# Patient Record
Sex: Male | Born: 1975
Health system: Southern US, Community
[De-identification: ages and names within clinical notes are randomized; demographics above are authoritative.]

## PROBLEM LIST (undated history)

## (undated) DIAGNOSIS — F419 Anxiety disorder, unspecified: Secondary | ICD-10-CM

## (undated) DIAGNOSIS — Z8669 Personal history of other diseases of the nervous system and sense organs: Secondary | ICD-10-CM

## (undated) DIAGNOSIS — T7840XA Allergy, unspecified, initial encounter: Secondary | ICD-10-CM

## (undated) DIAGNOSIS — E7800 Pure hypercholesterolemia, unspecified: Secondary | ICD-10-CM

## (undated) DIAGNOSIS — Z8619 Personal history of other infectious and parasitic diseases: Secondary | ICD-10-CM

## (undated) DIAGNOSIS — M1990 Unspecified osteoarthritis, unspecified site: Secondary | ICD-10-CM

## (undated) DIAGNOSIS — K5792 Diverticulitis of intestine, part unspecified, without perforation or abscess without bleeding: Secondary | ICD-10-CM

## (undated) HISTORY — DX: Anxiety disorder, unspecified: F41.9

## (undated) HISTORY — DX: Personal history of other infectious and parasitic diseases: Z86.19

## (undated) HISTORY — DX: Personal history of other diseases of the nervous system and sense organs: Z86.69

## (undated) HISTORY — DX: Allergy, unspecified, initial encounter: T78.40XA

## (undated) HISTORY — DX: Diverticulitis of intestine, part unspecified, without perforation or abscess without bleeding: K57.92

## (undated) HISTORY — DX: Pure hypercholesterolemia, unspecified: E78.00

---
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
## 2005-07-30 ENCOUNTER — Ambulatory Visit: Payer: Self-pay | Admitting: Internal Medicine

## 2005-08-01 ENCOUNTER — Ambulatory Visit: Payer: Self-pay | Admitting: Internal Medicine

## 2005-12-19 ENCOUNTER — Emergency Department (HOSPITAL_COMMUNITY): Admission: EM | Admit: 2005-12-19 | Discharge: 2005-12-19 | Payer: Self-pay | Admitting: Emergency Medicine

## 2006-03-13 ENCOUNTER — Ambulatory Visit: Payer: Self-pay | Admitting: Internal Medicine

## 2006-03-13 ENCOUNTER — Ambulatory Visit (HOSPITAL_COMMUNITY): Admission: RE | Admit: 2006-03-13 | Discharge: 2006-03-13 | Payer: Self-pay | Admitting: Internal Medicine

## 2006-03-28 ENCOUNTER — Ambulatory Visit: Payer: Self-pay | Admitting: Internal Medicine

## 2007-02-02 ENCOUNTER — Ambulatory Visit: Payer: Self-pay | Admitting: Internal Medicine

## 2007-02-06 ENCOUNTER — Ambulatory Visit: Payer: Self-pay | Admitting: Internal Medicine

## 2007-02-06 LAB — CONVERTED CEMR LAB
AST: 22 units/L (ref 0–37)
Albumin: 3.8 g/dL (ref 3.5–5.2)
Cholesterol: 170 mg/dL (ref 0–200)
Potassium: 3.8 meq/L (ref 3.5–5.1)
Sodium: 141 meq/L (ref 135–145)
Total Bilirubin: 0.4 mg/dL (ref 0.3–1.2)
Total CHOL/HDL Ratio: 4.1
VLDL: 14 mg/dL (ref 0–40)

## 2007-07-21 DIAGNOSIS — Z87898 Personal history of other specified conditions: Secondary | ICD-10-CM | POA: Insufficient documentation

## 2007-07-21 DIAGNOSIS — Z8719 Personal history of other diseases of the digestive system: Secondary | ICD-10-CM | POA: Insufficient documentation

## 2007-07-21 DIAGNOSIS — Z8669 Personal history of other diseases of the nervous system and sense organs: Secondary | ICD-10-CM | POA: Insufficient documentation

## 2007-10-28 ENCOUNTER — Encounter (INDEPENDENT_AMBULATORY_CARE_PROVIDER_SITE_OTHER): Payer: Self-pay | Admitting: *Deleted

## 2007-10-28 ENCOUNTER — Ambulatory Visit: Payer: Self-pay | Admitting: Endocrinology

## 2007-10-28 DIAGNOSIS — R112 Nausea with vomiting, unspecified: Secondary | ICD-10-CM | POA: Insufficient documentation

## 2008-06-03 ENCOUNTER — Ambulatory Visit: Payer: Self-pay | Admitting: Internal Medicine

## 2008-06-03 DIAGNOSIS — E299 Testicular dysfunction, unspecified: Secondary | ICD-10-CM | POA: Insufficient documentation

## 2008-06-08 ENCOUNTER — Ambulatory Visit (HOSPITAL_BASED_OUTPATIENT_CLINIC_OR_DEPARTMENT_OTHER): Admission: RE | Admit: 2008-06-08 | Discharge: 2008-06-08 | Payer: Self-pay | Admitting: Internal Medicine

## 2008-06-09 ENCOUNTER — Telehealth: Payer: Self-pay | Admitting: Internal Medicine

## 2008-06-22 ENCOUNTER — Ambulatory Visit: Payer: Self-pay | Admitting: Internal Medicine

## 2008-06-22 LAB — CONVERTED CEMR LAB
LDH: 165 units/L (ref 94–250)
TSH: 0.5 microintl units/mL (ref 0.35–5.50)
Triglycerides: 61 mg/dL (ref 0–149)

## 2008-06-24 ENCOUNTER — Encounter: Payer: Self-pay | Admitting: Internal Medicine

## 2008-06-27 ENCOUNTER — Encounter (INDEPENDENT_AMBULATORY_CARE_PROVIDER_SITE_OTHER): Payer: Self-pay | Admitting: *Deleted

## 2008-06-27 ENCOUNTER — Ambulatory Visit: Payer: Self-pay | Admitting: Internal Medicine

## 2008-06-29 ENCOUNTER — Ambulatory Visit: Payer: Self-pay | Admitting: *Deleted

## 2008-06-29 DIAGNOSIS — S335XXA Sprain of ligaments of lumbar spine, initial encounter: Secondary | ICD-10-CM | POA: Insufficient documentation

## 2009-04-17 ENCOUNTER — Ambulatory Visit: Payer: Self-pay | Admitting: Internal Medicine

## 2010-02-05 ENCOUNTER — Emergency Department (HOSPITAL_BASED_OUTPATIENT_CLINIC_OR_DEPARTMENT_OTHER): Admission: EM | Admit: 2010-02-05 | Discharge: 2010-02-05 | Payer: Self-pay | Admitting: Emergency Medicine

## 2010-03-13 ENCOUNTER — Ambulatory Visit: Payer: Self-pay | Admitting: Internal Medicine

## 2010-03-15 ENCOUNTER — Encounter: Payer: Self-pay | Admitting: Internal Medicine

## 2010-03-15 LAB — CONVERTED CEMR LAB
ALT: 28 units/L (ref 0–53)
Albumin: 4.9 g/dL (ref 3.5–5.2)
Alkaline Phosphatase: 59 units/L (ref 39–117)
Cholesterol: 191 mg/dL (ref 0–200)
Indirect Bilirubin: 0.6 mg/dL (ref 0.0–0.9)
Rubeola IgG: 1.83 — ABNORMAL HIGH
Total Bilirubin: 0.8 mg/dL (ref 0.3–1.2)
Total CHOL/HDL Ratio: 4
Triglycerides: 93 mg/dL (ref <150)

## 2010-06-25 ENCOUNTER — Telehealth: Payer: Self-pay | Admitting: Internal Medicine

## 2010-09-18 NOTE — Progress Notes (Signed)
Summary: Lipitor refill  Phone Note Refill Request Call back at [phone] Message from:  Patient on June 25, 2010 8:52 AM  Refills Requested: Medication #1:  LIPITOR 10 MG  TABS Take 1 tablet by mouth once a day.   Dosage confirmed as above?Dosage Confirmed   Brand Name Necessary? No   Supply Requested: 3 months   Last Refilled: 03/13/2010  Method Requested: Electronic Next Appointment Scheduled: none Initial call taken by: Lannette Donath,  June 25, 2010 8:53 AM  Follow-up for Phone Call        call returned to patient at [phone], patients wife Angelique Blonder states she had requested the refill on the patients behalf. She state she received an email stating rx was expired. She was informed a new rx for Lipitor is on file at [facility], to please check with the pharmacy. She was informed that I spoke with the Pharmacy and rx is on file. Follow-up by: Glendell Docker CMA,  June 25, 2010 10:14 AM

## 2010-09-18 NOTE — Assessment & Plan Note (Signed)
Summary: cpx/dt   Vital Signs:  Patient profile:   35 year old male Height:      70 inches Weight:      177.75 pounds BMI:     25.60 O2 Sat:      95 % on Room air Temp:     98.0 degrees F oral Pulse rate:   74 / minute Pulse rhythm:   regular Resp:     18 per minute BP sitting:   100 / 60  (left arm) Cuff size:   large  Vitals Entered By: Glendell Docker CMA (March 13, 2010 11:12 AM)  O2 Flow:  Room air CC: Rm 2- CPX Is Patient Diabetic? No Pain Assessment Patient in pain? no       Does patient need assistance? Functional Status Self care Ambulation Normal Comments fasting for labs, physical form completion for work   Primary Care Provider:  D. Thomos Lemons DO  CC:  Rm 2- CPX.  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 * Flu Vaccination 2)  ! Sulfa 3)  ! 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    Physical Exam  General:  alert, well-developed, and well-nourished.   Head:  normocephalic and atraumatic.   Eyes:  pupils equal, pupils round, and pupils reactive to light.   Ears:  R ear normal and L ear normal.   Mouth:  good dentition and pharynx pink and moist.   Neck:  supple and no masses.   Lungs:  normal respiratory effort, normal breath sounds, no crackles, and no wheezes.   Heart:  normal rate, regular rhythm, no murmur, and no gallop.   Abdomen:  soft, non-tender, normal bowel sounds, no hepatomegaly, and no splenomegaly.   Extremities:  No clubbing, cyanosis, edema, or deformity noted with normal full range of motion of all joints.   Neurologic:  cranial nerves II-XII intact and gait normal.     Impression & Recommendations:  Problem # 1:  HEALTH MAINTENANCE EXAM (ICD-V70.0) Routine employer cpx.  I urged regular exercise.  PPD placed.  check MMR titer as requested   Orders: T- * Misc. Laboratory test [id number])  Td Booster: Tdap (01/19/2010)   Chol: 173 (  06/22/2008)   HDL: 44.0 (06/22/2008)   LDL: 117 (06/22/2008)   TG: 61 (06/22/2008) TSH: 0.50 (06/22/2008)     Problem # 2:  HYPERCHOLESTEROLEMIA (ICD-272.0) he is tolerating lipitor.  monitors LFTs and FLP His updated medication list for this problem includes:    Lipitor 10 Mg Tabs (Atorvastatin calcium) .[name] Kitchen... Take 1 tablet by mouth once a day  Orders: T-Lipid Profile ([id number]) T-Hepatic Function [id number])  Complete Medication List: 1)  Lipitor 10 Mg Tabs (Atorvastatin calcium) .... Take 1 tablet by mouth once a day  Other Orders: TB Skin Test [id number]) Admin 1st Vaccine ([id number])  Patient Instructions: 1)  Please schedule a follow-up appointment in 1 year. Prescriptions: LIPITOR 10 MG  TABS (ATORVASTATIN CALCIUM) Take  1 tablet by mouth once a day  #90 x 3   Entered and Authorized by:   D. Thomos Lemons DO   Signed by:   D. Thomos Lemons DO on 03/13/2010   Method used:   Electronically to        [facility]* (retail)       [address]       [address] Shipping/mailing       [place], [place]  [address]       Ph: [phone]       Fax: [phone]   RxID:   [id number]   Current Allergies (reviewed today): ! * FLU VACCINATION ! SULFA ! PCN   Preventive Care Screening  Last Tetanus Booster:    Date:  01/19/2010    Results:  Tdap    Immunizations Administered:  PPD Skin Test:    Vaccine Type: PPD    Site: left forearm    Mfr: Sanofi Pasteur    Dose: 0.1 ml    Route: ID    Given by: Glendell Docker CMA    Exp. Date: 06/21/2011    Lot #: [id number]   Prevention & Chronic Care Immunizations   Influenza vaccine: Not documented    Tetanus booster: 01/19/2010: Tdap    Pneumococcal vaccine: Not documented  Other Screening   Smoking status: never  (03/13/2010)  Lipids   Total Cholesterol: 173  (06/22/2008)   LDL: 117  (06/22/2008)   LDL Direct: Not documented   HDL: 44.0  (06/22/2008)   Triglycerides: 61  (06/22/2008)    SGOT (AST): 22  (06/22/2008)   SGPT (ALT): 29  (06/22/2008)   Alkaline phosphatase: 56  (02/06/2007)   Total bilirubin: 0.4  (02/06/2007)  Self-Management Support :    Lipid self-management support: Not documented

## 2010-09-18 NOTE — Assessment & Plan Note (Signed)
Summary: TB Skin Test Read  Nurse Visit   Primary Care Provider:  D. Thomos Lemons DO  CC:  TB skin test recheck.  History of Present Illness: The patient presented after 48 hours to check the injection site for positive or negative reaction  Injection site examination: No firm bump forms at the test site. Slightly reddish appearance and diameter was smaller than 5mm.  Assessment & Plan:  Negative TB skin test. Patient was counseled to call if experience any irritation of site.  Glendell Docker CMA  March 15, 2010 4:49 PM    Allergies: 1)  ! * Flu Vaccination 2)  ! Sulfa 3)  ! Pcn  PPD Results    Date of reading: 03/15/2010    Results: < 5mm    Interpretation: negative

## 2010-09-18 NOTE — Letter (Signed)
Summary: Employee Health Examination Certificate  Employee Health Examination Certificate   Imported By: Maryln Gottron 03/27/2010 15:53:53  _____________________________________________________________________  External Attachment:    Type:   Image     Comment:   External Document

## 2011-03-28 ENCOUNTER — Other Ambulatory Visit: Payer: Self-pay | Admitting: Internal Medicine

## 2011-03-28 NOTE — Telephone Encounter (Signed)
Rx refill sent to pharmacy.Patient is due for office visit 

## 2011-03-29 ENCOUNTER — Encounter: Payer: Self-pay | Admitting: Internal Medicine

## 2011-04-09 ENCOUNTER — Ambulatory Visit (INDEPENDENT_AMBULATORY_CARE_PROVIDER_SITE_OTHER): Payer: 59 | Admitting: Internal Medicine

## 2011-04-09 ENCOUNTER — Encounter: Payer: Self-pay | Admitting: Internal Medicine

## 2011-04-09 DIAGNOSIS — E7800 Pure hypercholesterolemia, unspecified: Secondary | ICD-10-CM

## 2011-04-09 DIAGNOSIS — Z0184 Encounter for antibody response examination: Secondary | ICD-10-CM | POA: Insufficient documentation

## 2011-04-09 DIAGNOSIS — F411 Generalized anxiety disorder: Secondary | ICD-10-CM

## 2011-04-09 DIAGNOSIS — Z0000 Encounter for general adult medical examination without abnormal findings: Secondary | ICD-10-CM

## 2011-04-09 DIAGNOSIS — E785 Hyperlipidemia, unspecified: Secondary | ICD-10-CM

## 2011-04-09 DIAGNOSIS — F419 Anxiety disorder, unspecified: Secondary | ICD-10-CM

## 2011-04-09 DIAGNOSIS — Z8249 Family history of ischemic heart disease and other diseases of the circulatory system: Secondary | ICD-10-CM

## 2011-04-09 MED ORDER — ATORVASTATIN CALCIUM 10 MG PO TABS: 10.0000 mg | ORAL_TABLET | Freq: Every day | ORAL | Status: DC

## 2011-04-09 MED ORDER — ESCITALOPRAM OXALATE 5 MG PO TABS: 5.0000 mg | ORAL_TABLET | Freq: Every day | ORAL | Status: DC

## 2011-04-09 NOTE — Progress Notes (Signed)
  Subjective:    Patient ID: Isaiah Leonard, male    DOB: [birth year], 35 y.o.   MRN: [id number]  HPI Pt presents to clinic for annual physical. Has h/o hyperlipidemia maintained on statin therapy without adverse effect. Notes family h/o premature heart disease in first degree relative. Denies chest pain, chest pressure, dyspnea at rest or with exertion. Taking low dose lexapro for h/o mild anxiety. Medication controls symptoms without side effect. No other complaints.  Reviewed pmh, medications and allergies.    Review of Systems  Constitutional: Negative for fatigue and unexpected weight change.  Respiratory: Negative for shortness of breath.   Cardiovascular: Negative for chest pain.  Gastrointestinal: Negative for abdominal pain.  All other systems reviewed and are negative.       Objective:   Physical Exam  Nursing note and vitals reviewed. Constitutional: He appears well-developed and well-nourished. No distress.  HENT:  Head: Normocephalic and atraumatic.  Right Ear: Tympanic membrane, external ear and ear canal normal.  Left Ear: Tympanic membrane, external ear and ear canal normal.  Nose: Nose normal.  Mouth/Throat: Oropharynx is clear and moist. No oropharyngeal exudate.  Eyes: Conjunctivae and EOM are normal. Pupils are equal, round, and reactive to light. Right eye exhibits no discharge. Left eye exhibits no discharge. No scleral icterus.  Neck: Neck supple. Carotid bruit is not present. No thyromegaly present.  Cardiovascular: Normal rate, regular rhythm, normal heart sounds and intact distal pulses.  Exam reveals no gallop and no friction rub.   No murmur heard. Pulmonary/Chest: Effort normal and breath sounds normal. No respiratory distress. He has no wheezes. He has no rales.  Abdominal: Bowel sounds are normal. He exhibits no distension and no mass. There is no hepatosplenomegaly. There is no tenderness. There is no rebound and no guarding. Hernia confirmed negative  in the right inguinal area and confirmed negative in the left inguinal area.  Genitourinary: Testes normal.  Lymphadenopathy:    He has no cervical adenopathy.       Right: No inguinal adenopathy present.       Left: No inguinal adenopathy present.  Neurological: He is alert.  Skin: Skin is warm and dry. No rash noted. He is not diaphoretic. No erythema. No pallor.  Psychiatric: He has a normal mood and affect.          Assessment & Plan:

## 2011-04-09 NOTE — Assessment & Plan Note (Signed)
Stable and mild. Well controlled with low dose ssri. rf lexapro.

## 2011-04-09 NOTE — Patient Instructions (Signed)
Please schedule lipid/lft 272.4 at [facility] prior to next visit

## 2011-04-09 NOTE — Assessment & Plan Note (Signed)
Nl exam. Obtain cpe labs. EKG obtained demonstrates nsr 75 with nl intervals/axis. No arrythmia or obvious evidence of ischemia.

## 2011-04-09 NOTE — Assessment & Plan Note (Signed)
Obtain NMR particle analysis with premature fam hx of cad.

## 2011-04-11 ENCOUNTER — Ambulatory Visit: Payer: 59

## 2011-04-11 DIAGNOSIS — E785 Hyperlipidemia, unspecified: Secondary | ICD-10-CM

## 2011-04-11 DIAGNOSIS — Z0000 Encounter for general adult medical examination without abnormal findings: Secondary | ICD-10-CM

## 2011-04-11 LAB — URINALYSIS, ROUTINE W REFLEX MICROSCOPIC
Leukocytes, UA: NEGATIVE
pH: 6 (ref 5.0–8.0)

## 2011-04-11 LAB — CBC WITH DIFFERENTIAL/PLATELET
Basophils Absolute: 0.1 10*3/uL (ref 0.0–0.1)
Basophils Relative: 0.8 % (ref 0.0–3.0)
Eosinophils Relative: 1.2 % (ref 0.0–5.0)
HCT: 43.6 % (ref 39.0–52.0)
Hemoglobin: 14.6 g/dL (ref 13.0–17.0)
Lymphs Abs: 1.8 10*3/uL (ref 0.7–4.0)
MCHC: 33.5 g/dL (ref 30.0–36.0)
Neutrophils Relative %: 69.5 % (ref 43.0–77.0)
RBC: 5.07 Mil/uL (ref 4.22–5.81)
WBC: 7.9 10*3/uL (ref 4.5–10.5)

## 2011-04-11 LAB — HEMOGLOBIN A1C: Hgb A1c MFr Bld: 6 % (ref 4.6–6.5)

## 2011-04-12 LAB — HEPATIC FUNCTION PANEL
ALT: 37 U/L (ref 0–53)
AST: 24 U/L (ref 0–37)

## 2011-04-12 LAB — LIPID PANEL
HDL: 50.3 mg/dL (ref >39.00)
Triglycerides: 103 mg/dL (ref 0.0–149.0)

## 2011-04-12 LAB — BASIC METABOLIC PANEL
CO2: 27 mEq/L (ref 19–32)
Chloride: 103 mEq/L (ref 96–112)
Creatinine, Ser: 0.9 mg/dL (ref 0.4–1.5)
Potassium: 3.7 mEq/L (ref 3.5–5.1)
Sodium: 137 mEq/L (ref 135–145)

## 2011-04-15 LAB — NMR LIPOPROFILE WITHOUT LIPIDS
HDL Particle Number: 36.2 umol/L (ref >=30.5)
LDL Particle Number: 1611 nmol/L — ABNORMAL HIGH (ref <1000)
LDL Size: 20.7 nm (ref >20.5)
LP-IR Score: 58 — ABNORMAL HIGH (ref <=45)

## 2011-04-24 ENCOUNTER — Other Ambulatory Visit: Payer: Self-pay | Admitting: Internal Medicine

## 2011-04-24 DIAGNOSIS — E7800 Pure hypercholesterolemia, unspecified: Secondary | ICD-10-CM

## 2011-04-24 MED ORDER — ATORVASTATIN CALCIUM 20 MG PO TABS: 20.0000 mg | ORAL_TABLET | Freq: Every day | ORAL | Status: DC

## 2011-09-09 ENCOUNTER — Other Ambulatory Visit: Payer: Self-pay | Admitting: Internal Medicine

## 2011-10-04 ENCOUNTER — Other Ambulatory Visit: Payer: Self-pay | Admitting: Internal Medicine

## 2011-10-04 ENCOUNTER — Other Ambulatory Visit: Payer: Self-pay | Admitting: *Deleted

## 2011-10-04 DIAGNOSIS — E7800 Pure hypercholesterolemia, unspecified: Secondary | ICD-10-CM

## 2011-10-04 LAB — BASIC METABOLIC PANEL
CO2: 24 mEq/L (ref 19–32)
Calcium: 9.2 mg/dL (ref 8.4–10.5)
Sodium: 143 mEq/L (ref 135–145)

## 2011-10-04 LAB — LIPID PANEL
HDL: 59 mg/dL (ref >39)
LDL Cholesterol: 98 mg/dL (ref 0–99)
VLDL: 10 mg/dL (ref 0–40)

## 2011-10-07 ENCOUNTER — Ambulatory Visit (INDEPENDENT_AMBULATORY_CARE_PROVIDER_SITE_OTHER): Payer: 59 | Admitting: Internal Medicine

## 2011-10-07 ENCOUNTER — Encounter: Payer: Self-pay | Admitting: Internal Medicine

## 2011-10-07 DIAGNOSIS — F419 Anxiety disorder, unspecified: Secondary | ICD-10-CM

## 2011-10-07 DIAGNOSIS — E7800 Pure hypercholesterolemia, unspecified: Secondary | ICD-10-CM

## 2011-10-07 DIAGNOSIS — R739 Hyperglycemia, unspecified: Secondary | ICD-10-CM

## 2011-10-07 DIAGNOSIS — R7303 Prediabetes: Secondary | ICD-10-CM | POA: Insufficient documentation

## 2011-10-07 DIAGNOSIS — R7309 Other abnormal glucose: Secondary | ICD-10-CM

## 2011-10-07 DIAGNOSIS — F411 Generalized anxiety disorder: Secondary | ICD-10-CM

## 2011-10-07 MED ORDER — ESCITALOPRAM OXALATE 5 MG PO TABS: 5.0000 mg | ORAL_TABLET | Freq: Every day | ORAL | Status: DC

## 2011-10-07 MED ORDER — ATORVASTATIN CALCIUM 20 MG PO TABS: 20.0000 mg | ORAL_TABLET | Freq: Every day | ORAL | Status: DC

## 2011-10-07 NOTE — Assessment & Plan Note (Signed)
Improved control. Obtain lipid/lft and repeat NMR particle analysis with next visit.

## 2011-10-07 NOTE — Patient Instructions (Signed)
Please schedule fasting labs prior to next visit Lipid, lft, NMR particle analysis 272.4 and a1c (hyperglycemia)

## 2011-10-07 NOTE — Assessment & Plan Note (Signed)
Stable. rf lexapro.

## 2011-10-07 NOTE — Progress Notes (Signed)
  Subjective:    Patient ID: Isaiah Leonard, male    DOB: 20-Jul-1976, 36 y.o.   MRN: [id number]  HPI Pt presents to clinic for followup of multiple medical problems. H/o hyperlipidemia and premature family hx of heart disease. S/p NMR particle analysis with subsequent increase of lipitor dose to 20mg  po qd. Tolerating without myalgias. F/u lipid under ideal control. lft's pending. Has noted minimal elevated of glucose on chem7. No active complaint. Total time of visit ~26 minutes of which >50% spent in counseling.   Past Medical History  Diagnosis Date  . Personal history of other diseases of digestive system   . Pure hypercholesterolemia   . History of shingles   . History of Guillain-Barre syndrome     age 64   No past surgical history on file.  reports that he has never smoked. He has never used smokeless tobacco. He reports that he drinks alcohol. He reports that he does not use illicit drugs. family history includes Diabetes in his mother; Hypertension in his father and mother; Multiple sclerosis in his sister; and Stroke in his brother. Allergies  Allergen Reactions  . Penicillins   . Sulfonamide Derivatives       Review of Systems see hpi     Objective:   Physical Exam  Nursing note and vitals reviewed. Constitutional: He appears well-developed and well-nourished. No distress.  Skin: He is not diaphoretic.          Assessment & Plan:

## 2011-10-07 NOTE — Assessment & Plan Note (Signed)
Minimal. Low sugar/carb diet and resume regular exercise. Obtain a1c with next visit labs.

## 2011-10-08 LAB — HEPATIC FUNCTION PANEL
ALT: 21 U/L (ref 0–53)
AST: 21 U/L (ref 0–37)
Albumin: 4.6 g/dL (ref 3.5–5.2)
Total Protein: 6.8 g/dL (ref 6.0–8.3)

## 2012-02-25 ENCOUNTER — Encounter: Payer: Self-pay | Admitting: Internal Medicine

## 2012-02-25 ENCOUNTER — Ambulatory Visit (INDEPENDENT_AMBULATORY_CARE_PROVIDER_SITE_OTHER): Payer: 59 | Admitting: Internal Medicine

## 2012-02-25 VITALS — BP 122/82 | HR 78 | Temp 98.0°F | Wt 178.0 lb

## 2012-02-25 DIAGNOSIS — M2550 Pain in unspecified joint: Secondary | ICD-10-CM

## 2012-02-25 LAB — URIC ACID: Uric Acid, Serum: 6.3 mg/dL (ref 4.0–7.8)

## 2012-02-25 LAB — SEDIMENTATION RATE: Sed Rate: 1 mm/hr (ref 0–16)

## 2012-02-25 NOTE — Progress Notes (Signed)
  Subjective:    Patient ID: Isaiah Leonard, male    DOB: August 18, 1976, 36 y.o.   MRN: [id number]  HPI Pt presents to clinic for evaluation of bony joint nodules. Notes chronic intermittent joint pain involving fingers and shoulders primarily. Recently noted development of bony nodules on distal finger joints. No inflammatory changes-denies swelling, erythema or warmth. Notes family hx of RA. May have seen rheumatology in the past several years ago. No alleviating or exacerbating factors.  Past Medical History  Diagnosis Date  . Personal history of other diseases of digestive system   . Pure hypercholesterolemia   . History of shingles   . History of Guillain-Barre syndrome     age 62   No past surgical history on file.  reports that he has never smoked. He has never used smokeless tobacco. He reports that he drinks alcohol. He reports that he does not use illicit drugs. family history includes Diabetes in his mother; Hypertension in his father and mother; Multiple sclerosis in his sister; and Stroke in his brother. Allergies  Allergen Reactions  . Penicillins   . Sulfonamide Derivatives        Review of Systems see hpi     Objective:   Physical Exam  Nursing note and vitals reviewed. Constitutional: He appears well-developed and well-nourished. No distress.  HENT:  Head: Normocephalic and atraumatic.  Musculoskeletal:       FROM bilateral hands/fingers. Small distal joint bony nodules without tenderness. No erythema, warmth or effusion.  Neurological: He is alert.  Skin: Skin is warm and dry. He is not diaphoretic.  Psychiatric: He has a normal mood and affect.          Assessment & Plan:

## 2012-02-25 NOTE — Assessment & Plan Note (Signed)
Obtain esr, RF, uric acid and ANA. May represent early OA.

## 2012-03-02 ENCOUNTER — Telehealth: Payer: Self-pay | Admitting: *Deleted

## 2012-03-02 NOTE — Telephone Encounter (Signed)
Notes Recorded by Regis Bill, CMA on 03/02/2012 at 10:01 AM Patient informed/SLS Notes Recorded by Verdene Rio, CMA on 02/28/2012 at 10:55 AM Left message to call office Notes Recorded by Edwyna Perfect, MD on 02/26/2012 at 6:44 PM Labs nl  Patient would like to know if he needs to F/U with you and/or elsewhere concerning nodules/SLS Please advise.

## 2012-03-02 NOTE — Telephone Encounter (Signed)
LMOM with contact name & number w/further MD detail [pt has f/u OV 08.24.13]/SLS

## 2012-03-02 NOTE — Telephone Encounter (Signed)
The lab tests do not suggest an unusual arthritis like rheumatoid or similar. We discussed in clinic that the bony finger nodules are suggestive of osteoarthritis.

## 2012-03-11 ENCOUNTER — Encounter: Payer: Self-pay | Admitting: Family

## 2012-03-11 ENCOUNTER — Telehealth: Payer: Self-pay | Admitting: Internal Medicine

## 2012-03-11 ENCOUNTER — Ambulatory Visit (INDEPENDENT_AMBULATORY_CARE_PROVIDER_SITE_OTHER): Payer: 59 | Admitting: Family

## 2012-03-11 VITALS — BP 130/100 | HR 89 | Temp 97.4°F | Resp 16 | Ht 70.0 in | Wt 181.0 lb

## 2012-03-11 DIAGNOSIS — K5732 Diverticulitis of large intestine without perforation or abscess without bleeding: Secondary | ICD-10-CM

## 2012-03-11 DIAGNOSIS — K5792 Diverticulitis of intestine, part unspecified, without perforation or abscess without bleeding: Secondary | ICD-10-CM | POA: Insufficient documentation

## 2012-03-11 MED ORDER — METRONIDAZOLE 500 MG PO TABS: 500.0000 mg | ORAL_TABLET | Freq: Three times a day (TID) | ORAL | Status: AC

## 2012-03-11 MED ORDER — CIPROFLOXACIN HCL 500 MG PO TABS: 500.0000 mg | ORAL_TABLET | Freq: Two times a day (BID) | ORAL | Status: AC

## 2012-03-11 NOTE — Patient Instructions (Addendum)
Keep a liquid diet for the next 2-3 days, then advance as tolerated to a low fiber diet as tolerated- (see below) until you are seen back in the office in 1 week. Go to the ER if you develop blood in stool, worsening abdominal pain, fever >101. Call if symptoms are not improved in 2-3 days. Follow up in 1 week.   Diverticulitis A diverticulum is a small pouch or sac on the colon. Diverticulosis is the presence of these diverticula on the colon. Diverticulitis is the irritation (inflammation) or infection of diverticula. CAUSES  The colon and its diverticula contain bacteria. If food particles block the tiny opening to a diverticulum, the bacteria inside can grow and cause an increase in pressure. This leads to infection and inflammation and is called diverticulitis. SYMPTOMS   Abdominal pain and tenderness. Usually, the pain is located on the left side of your abdomen. However, it could be located elsewhere.   Fever.   Bloating.   Feeling sick to your stomach (nausea).   Throwing up (vomiting).   Abnormal stools.  DIAGNOSIS  Your caregiver will take a history and perform a physical exam. Since many things can cause abdominal pain, other tests may be necessary. Tests may include:  Blood tests.   Urine tests.   X-ray of the abdomen.   CT scan of the abdomen.  Sometimes, surgery is needed to determine if diverticulitis or other conditions are causing your symptoms. TREATMENT  Most of the time, you can be treated without surgery. Treatment includes:  Resting the bowels by only having liquids for a few days. As you improve, you will need to eat a low-fiber diet.   Intravenous (IV) fluids if you are losing body fluids (dehydrated).   Antibiotic medicines that treat infections may be given.   Pain and nausea medicine, if needed.   Surgery if the inflamed diverticulum has burst.  HOME CARE INSTRUCTIONS   Try a clear liquid diet (broth, tea, or water for as long as directed by  your caregiver). You may then gradually begin a low-fiber diet as tolerated. A low-fiber diet is a diet with less than 10 grams of fiber. Choose the foods below to reduce fiber in the diet:   White breads, cereals, rice, and pasta.   Cooked fruits and vegetables or soft fresh fruits and vegetables without the skin.   Ground or well-cooked tender beef, ham, veal, lamb, pork, or poultry.   Eggs and seafood.   After your diverticulitis symptoms have improved, your caregiver may put you on a high-fiber diet. A high-fiber diet includes 14 grams of fiber for every 1000 calories consumed. For a standard 2000 calorie diet, you would need 28 grams of fiber. Follow these diet guidelines to help you increase the fiber in your diet. It is important to slowly increase the amount fiber in your diet to avoid gas, constipation, and bloating.   Choose whole-grain breads, cereals, pasta, and brown rice.   Choose fresh fruits and vegetables with the skin on. Do not overcook vegetables because the more vegetables are cooked, the more fiber is lost.   Choose more nuts, seeds, legumes, dried peas, beans, and lentils.   Look for food products that have greater than 3 grams of fiber per serving on the Nutrition Facts label.   Take all medicine as directed by your caregiver.   If your caregiver has given you a follow-up appointment, it is very important that you go. Not going could result in lasting (chronic)  or permanent injury, pain, and disability. If there is any problem keeping the appointment, call to reschedule.  SEEK MEDICAL CARE IF:   Your pain does not improve.   You have a hard time advancing your diet beyond clear liquids.   Your bowel movements do not return to normal.  SEEK IMMEDIATE MEDICAL CARE IF:   Your pain becomes worse.   You have an oral temperature above 102 F (38.9 C), not controlled by medicine.   You have repeated vomiting.   You have bloody or black, tarry stools.    Symptoms that brought you to your caregiver become worse or are not getting better.  MAKE SURE YOU:   Understand these instructions.   Will watch your condition.   Will get help right away if you are not doing well or get worse.  Document Released: 05/15/2005 Document Revised: 07/25/2011 Document Reviewed: 09/10/2010 [facility] Patient Information 2012 [place], [place].

## 2012-03-11 NOTE — Assessment & Plan Note (Signed)
36 yr old male with hx of diverticulitis presents with 24 hr hx of LLQ pain.  Symptoms most consistent with diverticultis. Will plan to treat with cipro and flagyl.   I also recommended to the patient that he undergo a CT of the abdomen today.  He refuses stating, "My wife is a Engineer, civil (consulting) and my sister is a doctor and they have signed off on this."  I explained to the patient that the CT scan is to help rule out abscess/bowel perforation/peritonitis which can sometimes be a complication of diverticulitis and at times can be life threatening.  Pt states that he understands but a CT scan is not needed.  Reiterated to pt that it remains my recommendation that he proceed with CT.  Pt advised to follow up and to  follow dietary recs as outlined in AVS.

## 2012-03-11 NOTE — Progress Notes (Signed)
  Subjective:    Patient ID: Isaiah Leonard, male    DOB: 02-Jan-1976, 36 y.o.   MRN: [id number]  HPI  Isaiah Leonard is a 36 yr old male who presents today with chief complaint of abdominal pain.  He has a known history of diverticulitis.  Reports tenderness on the left side with associated mucousy bowel movements since yesterday.  He denies associated fever, vomiting or hematochezia.    Pt expressed to CMA that he was very upset about being asked to come in to be seen.  He asks me to check if diverticulitis is listed in his chart, "before this gets out of hand."   Review of Systems See HPI  Past Medical History  Diagnosis Date  . Personal history of other diseases of digestive system   . Pure hypercholesterolemia   . History of shingles   . History of Guillain-Barre syndrome     age 18    History   Social History  . Marital Status: Married    Spouse Name: N/A    Number of Children: N/A  . Years of Education: N/A   Occupational History  . Not on file.   Social History Main Topics  . Smoking status: Never Smoker   . Smokeless tobacco: Never Used  . Alcohol Use: Yes  . Drug Use: No  . Sexually Active: Not on file   Other Topics Concern  . 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  Family History  Problem Relation Age of Onset  . Diabetes Mother   . Hypertension Mother   . Hypertension Father   . Multiple sclerosis Sister   . Stroke Brother     history of etoh,a nd drug abuse    Allergies  Allergen Reactions  . Penicillins   . Sulfonamide Derivatives     Current Outpatient Prescriptions on File Prior to Visit  Medication Sig Dispense Refill  . atorvastatin (LIPITOR) 20 MG tablet Take 1 tablet (20 mg total) by  mouth daily.  90 tablet  3  . escitalopram (LEXAPRO) 5 MG tablet Take 1 tablet (5 mg total) by mouth daily.  90 tablet  3    BP 130/100  Pulse 89  Temp 97.4 F (36.3 C) (Oral)  Resp 16  Ht 5\' 10"  (1.778 m)  Wt 181 lb (82.101 kg)  BMI 25.97 kg/m2  SpO2 99%       Objective:   Physical Exam  Constitutional: He appears well-developed and well-nourished. No distress.  Cardiovascular: Normal rate and regular rhythm.   No murmur heard. Pulmonary/Chest: Effort normal and breath sounds normal. No respiratory distress. He has no wheezes. He has no rales. He exhibits no tenderness.  Abdominal: Soft. Bowel sounds are normal.       + left lower quadrant tenderness to palpation with mild guarding.   Psychiatric: His speech is normal and behavior is normal. Judgment and thought content normal. His affect is angry. Cognition and memory are normal.          Assessment & Plan:

## 2012-03-11 NOTE — Telephone Encounter (Signed)
Patient states that he has a hx of diverticulitis and feels like he is having a flare-up with it. He states that all of his symptoms are the same with diverticulitis. He would like to know if Dr. Rodena Medin would prescribe him Cipro? Or, would he need to come in?  [facility]. Thanks!

## 2012-03-11 NOTE — Telephone Encounter (Signed)
Per Vo, Sandford Craze, scheduled Pt OV at 2:30p today/SLS

## 2012-03-11 NOTE — Telephone Encounter (Signed)
Per Vo,

## 2012-04-13 ENCOUNTER — Encounter: Payer: Self-pay | Admitting: Internal Medicine

## 2012-04-13 ENCOUNTER — Ambulatory Visit (INDEPENDENT_AMBULATORY_CARE_PROVIDER_SITE_OTHER): Payer: 59 | Admitting: Internal Medicine

## 2012-04-13 ENCOUNTER — Ambulatory Visit (HOSPITAL_BASED_OUTPATIENT_CLINIC_OR_DEPARTMENT_OTHER)
Admission: RE | Admit: 2012-04-13 | Discharge: 2012-04-13 | Disposition: A | Discharge status: Home / Self Care | Payer: 59 | Source: Ambulatory Visit | Attending: Internal Medicine | Admitting: Internal Medicine

## 2012-04-13 VITALS — BP 118/76 | HR 85 | Temp 98.1°F | Resp 16 | Ht 68.0 in | Wt 183.0 lb

## 2012-04-13 DIAGNOSIS — M25519 Pain in unspecified shoulder: Secondary | ICD-10-CM

## 2012-04-13 DIAGNOSIS — R739 Hyperglycemia, unspecified: Secondary | ICD-10-CM

## 2012-04-13 DIAGNOSIS — R7309 Other abnormal glucose: Secondary | ICD-10-CM

## 2012-04-13 DIAGNOSIS — E7800 Pure hypercholesterolemia, unspecified: Secondary | ICD-10-CM

## 2012-04-13 MED ORDER — DICLOFENAC SODIUM 75 MG PO TBEC: DELAYED_RELEASE_TABLET | ORAL | Status: AC

## 2012-04-13 NOTE — Progress Notes (Signed)
  Subjective:    Patient ID: Isaiah Leonard, male    DOB: [birth year], 36 y.o.   MRN: [id number]  HPI  Pt presents to clinic for followup of multiple medical problems.  Past Medical History  Diagnosis Date  . Personal history of other diseases of digestive system   . Pure hypercholesterolemia   . History of shingles   . History of Guillain-Barre syndrome     age 20   No past surgical history on file.  reports that he has never smoked. He has never used smokeless tobacco. He reports that he drinks alcohol. He reports that he does not use illicit drugs. family history includes Diabetes in his mother; Hypertension in his father and mother; Multiple sclerosis in his sister; and Stroke in his brother. Allergies  Allergen Reactions  . Aspirin   . Penicillins   . Sulfonamide Derivatives       Review of Systems see hpi     Objective:   Physical Exam  Nursing note and vitals reviewed. Constitutional: He appears well-developed and well-nourished. No distress.  HENT:  Head: Normocephalic and atraumatic.  Musculoskeletal:       Left shoulder-no crepitus. FROM though demonstrates compensatory drop of right shoulder with abduction. +pain with abduction against resistance.   Neurological: He is alert.  Skin: He is not diaphoretic.  Psychiatric: He has a normal mood and affect.          Assessment & Plan:

## 2012-04-13 NOTE — Assessment & Plan Note (Signed)
Obtain lipid/lft. 

## 2012-04-13 NOTE — Patient Instructions (Signed)
Please schedule fasting labs prior to your next appointment Lipid/lft-272.4 and chem7-hyperglycemia

## 2012-04-13 NOTE — Assessment & Plan Note (Signed)
Obtain chem7/a1c

## 2012-04-13 NOTE — Assessment & Plan Note (Signed)
Obtain plain radiograph of left shoulder. Attempt course of voltaren with food and no other nsaid. PT not currently feasible with pt's schedule. Consider referral if no improvement

## 2012-06-13 ENCOUNTER — Encounter (HOSPITAL_BASED_OUTPATIENT_CLINIC_OR_DEPARTMENT_OTHER): Payer: Self-pay | Admitting: Student

## 2012-06-13 ENCOUNTER — Emergency Department (HOSPITAL_BASED_OUTPATIENT_CLINIC_OR_DEPARTMENT_OTHER)
Admission: EM | Admit: 2012-06-13 | Discharge: 2012-06-13 | Disposition: A | Discharge status: Home / Self Care | Payer: 59 | Attending: Emergency Medicine | Admitting: Emergency Medicine

## 2012-06-13 ENCOUNTER — Emergency Department (HOSPITAL_BASED_OUTPATIENT_CLINIC_OR_DEPARTMENT_OTHER): Payer: 59

## 2012-06-13 DIAGNOSIS — E7800 Pure hypercholesterolemia, unspecified: Secondary | ICD-10-CM | POA: Insufficient documentation

## 2012-06-13 DIAGNOSIS — S61209A Unspecified open wound of unspecified finger without damage to nail, initial encounter: Secondary | ICD-10-CM | POA: Insufficient documentation

## 2012-06-13 DIAGNOSIS — Z8619 Personal history of other infectious and parasitic diseases: Secondary | ICD-10-CM | POA: Insufficient documentation

## 2012-06-13 DIAGNOSIS — Z8719 Personal history of other diseases of the digestive system: Secondary | ICD-10-CM | POA: Insufficient documentation

## 2012-06-13 DIAGNOSIS — IMO0001 Reserved for inherently not codable concepts without codable children: Secondary | ICD-10-CM | POA: Insufficient documentation

## 2012-06-13 DIAGNOSIS — Y929 Unspecified place or not applicable: Secondary | ICD-10-CM | POA: Insufficient documentation

## 2012-06-13 DIAGNOSIS — Y9389 Activity, other specified: Secondary | ICD-10-CM | POA: Insufficient documentation

## 2012-06-13 DIAGNOSIS — Z79899 Other long term (current) drug therapy: Secondary | ICD-10-CM | POA: Insufficient documentation

## 2012-06-13 DIAGNOSIS — G610 Guillain-Barre syndrome: Secondary | ICD-10-CM | POA: Insufficient documentation

## 2012-06-13 DIAGNOSIS — S61259A Open bite of unspecified finger without damage to nail, initial encounter: Secondary | ICD-10-CM

## 2012-06-13 MED ORDER — CLINDAMYCIN HCL 150 MG PO CAPS: 300.0000 mg | ORAL_CAPSULE | Freq: Three times a day (TID) | ORAL | Status: AC

## 2012-06-13 MED ORDER — CLINDAMYCIN HCL 150 MG PO CAPS
300.0000 mg | ORAL_CAPSULE | Freq: Once | ORAL | Status: AC
  Administered 2012-06-13: 300 mg via ORAL
  Filled 2012-06-13: qty 2

## 2012-06-13 NOTE — ED Notes (Signed)
Patient states that he was bitten by a horse this AM.  Injury is on his right hand, 4 th digit. A i.5 cm lac noted on the ventral side of his finger with some abrasions noted on the dorsal surface of his finger. CMS intact.

## 2012-06-13 NOTE — ED Provider Notes (Signed)
Medical screening examination/treatment/procedure(s) were performed by non-physician practitioner and as supervising physician I was immediately available for consultation/collaboration.   Rolan Bucco, MD 06/13/12 1536

## 2012-06-13 NOTE — ED Provider Notes (Signed)
History     CSN: [id number]  Arrival date & time 06/13/12  1402   First MD Initiated Contact with Patient 06/13/12 1416      Chief Complaint  Patient presents with  . Animal Bite    4th digit on right hand    (Consider location/radiation/quality/duration/timing/severity/associated sxs/prior treatment) Patient is a 36 y.o. male presenting with animal bite. The history is provided by the patient.  Animal Bite  The incident occurred just prior to arrival. Pertinent negatives include no numbness. Associated symptoms comments: Patient bitten by his horse on right ring finger. No other injury..    Past Medical History  Diagnosis Date  . Personal history of other diseases of digestive system   . Pure hypercholesterolemia   . History of shingles   . History of Guillain-Barre syndrome     age 17    History reviewed. No pertinent past surgical history.  Family History  Problem Relation Age of Onset  . Diabetes Mother   . Hypertension Mother   . Hypertension Father   . Multiple sclerosis Sister   . Stroke Brother     history of etoh,a nd drug abuse    History  Substance Use Topics  . Smoking status: Never Smoker   . Smokeless tobacco: Never Used  . Alcohol Use: Yes      Review of Systems  Musculoskeletal:       See HPI.  Skin:       See HPI. C/O laceration/abrasion top and bottom of right ring finger.  Neurological: Negative for numbness.    Allergies  Aspirin; Penicillins; and Sulfonamide derivatives  Home Medications   Current Outpatient Rx  Name Route Sig Dispense Refill  . ATORVASTATIN CALCIUM 20 MG PO TABS Oral Take 1 tablet (20 mg total) by mouth daily. 90 tablet 3  . ESCITALOPRAM OXALATE 5 MG PO TABS Oral Take 1 tablet (5 mg total) by mouth daily. 90 tablet 3    BP 136/107  Pulse 71  Temp 98.5 F (36.9 C) (Oral)  Resp 20  Wt 185 lb (83.915 kg)  SpO2 100%  Physical Exam  Constitutional: He is oriented to person, place, and time. He appears  well-developed and well-nourished.  Neck: Normal range of motion.  Pulmonary/Chest: Effort normal.  Musculoskeletal: Normal range of motion.       FROM all joints of right 4th finger. Minimal swelling.Nail intact.  Neurological: He is alert and oriented to person, place, and time.  Skin: Skin is warm and dry.       Superficial abrasion to dorsal right 4th finger distally and shallow laceration palmar finger over DIP joint.   Psychiatric: He has a normal mood and affect.    ED Course  Procedures (including critical care time)  Labs Reviewed - No data to display Dg Finger Ring Right  06/13/2012  *RADIOLOGY REPORT*  Clinical Data: 36 year old male with right ring finger injury and pain.  RIGHT RING FINGER 2+V  Comparison: 03/13/2006  Findings: No evidence of acute fracture, subluxation or dislocation identified.  No radio-opaque foreign bodies are present.  No focal bony lesions are noted.  The joint spaces are unremarkable.  IMPRESSION: Unremarkable right ring finger.   Original Report Authenticated By: Rosendo Gros, M.D.      No diagnosis found.  1. Horse bite, finger   MDM  Wounds will not be sutured secondary to mechanism of injury. X-ray negative. Will treat with abx and bulky dressing.  Rodena Medin, PA-C 06/13/12 1530

## 2012-06-13 NOTE — ED Notes (Signed)
Horse bite to digit 4 on right hand.

## 2012-08-18 ENCOUNTER — Encounter: Payer: Self-pay | Admitting: Internal Medicine

## 2012-08-18 ENCOUNTER — Ambulatory Visit (INDEPENDENT_AMBULATORY_CARE_PROVIDER_SITE_OTHER): Payer: 59 | Admitting: Internal Medicine

## 2012-08-18 VITALS — BP 126/84 | HR 74 | Temp 98.0°F | Resp 16 | Wt 184.5 lb

## 2012-08-18 DIAGNOSIS — M7710 Lateral epicondylitis, unspecified elbow: Secondary | ICD-10-CM

## 2012-08-18 MED ORDER — METHYLPREDNISOLONE 4 MG PO KIT: PACK | ORAL | Status: DC

## 2012-08-18 NOTE — Progress Notes (Signed)
  Subjective:    Patient ID: Isaiah Leonard, male    DOB: 1976-04-25, 36 y.o.   MRN: [id number]  HPI Pt presents to clinic for evaluation of elbow pain. Notes persistent right lateral elbow pain worsening over the last one month. No injury. Taking no medication for the problem. Pain worsens with pronation. No other alleviating or exacerbating factors.   Past Medical History  Diagnosis Date  . Personal history of other diseases of digestive system   . Pure hypercholesterolemia   . History of shingles   . History of Guillain-Barre syndrome     age 28   No past surgical history on file.  reports that he has never smoked. He has never used smokeless tobacco. He reports that he drinks alcohol. He reports that he does not use illicit drugs. family history includes Diabetes in his mother; Hypertension in his father and mother; Multiple sclerosis in his sister; and Stroke in his brother. Allergies  Allergen Reactions  . Aspirin   . Penicillins   . Sulfonamide Derivatives      Review of Systems see hpi    Objective:   Physical Exam  Nursing note and vitals reviewed. Constitutional: He appears well-developed and well-nourished. No distress.  HENT:  Head: Normocephalic and atraumatic.  Musculoskeletal:       Right elbow-no erythema warmth or effusion. Mild tenderness lateral epicondyle. FROM of elbow.   Neurological: He is alert.  Skin: Skin is warm and dry. He is not diaphoretic.  Psychiatric: He has a normal mood and affect.          Assessment & Plan:

## 2012-08-22 DIAGNOSIS — M7710 Lateral epicondylitis, unspecified elbow: Secondary | ICD-10-CM | POA: Insufficient documentation

## 2012-08-22 NOTE — Assessment & Plan Note (Signed)
Attempt medrol dosepak. Followup if no improvement or worsening. Consider sports medicine consult if sx's persist

## 2012-09-14 ENCOUNTER — Other Ambulatory Visit: Payer: Self-pay | Admitting: Family Medicine

## 2012-09-14 ENCOUNTER — Telehealth: Payer: Self-pay | Admitting: Internal Medicine

## 2012-09-14 DIAGNOSIS — IMO0002 Reserved for concepts with insufficient information to code with codable children: Secondary | ICD-10-CM

## 2012-09-14 NOTE — Telephone Encounter (Signed)
Have referred him to sports med, let him know to try icing the elbow for 15 minutes and then applying Aspercreme twice daily til seen by sprots med

## 2012-09-14 NOTE — Telephone Encounter (Signed)
Dr Rodena Medin gave the patient an anti inflammatory for his elbow.  Told the patient to call if when he came off the meds he still had pain in the elbow and we would do a referral to sports medicine.  Patient wants the referral

## 2012-09-14 NOTE — Telephone Encounter (Signed)
Please advise 

## 2012-09-15 NOTE — Telephone Encounter (Signed)
Pt informed and voiced understanding. Pt also asked when his appt with Efraim Kaufmann was and I informed him 10-16-12

## 2012-09-15 NOTE — Telephone Encounter (Signed)
Left a message for patient to return my call. 

## 2012-09-18 ENCOUNTER — Ambulatory Visit: Payer: 59 | Admitting: Family Medicine

## 2012-09-21 ENCOUNTER — Ambulatory Visit (INDEPENDENT_AMBULATORY_CARE_PROVIDER_SITE_OTHER): Payer: 59 | Admitting: Family Medicine

## 2012-09-21 VITALS — BP 143/94 | HR 87 | Ht 68.0 in | Wt 183.0 lb

## 2012-09-21 DIAGNOSIS — M7710 Lateral epicondylitis, unspecified elbow: Secondary | ICD-10-CM

## 2012-09-21 NOTE — Patient Instructions (Addendum)
You have lateral epicondylitis and forearm strain. Try to avoid painful activities as much as possible except when doing home exercises. Ice the area 3-4 times a day for 15 minutes at a time. Tylenol or aleve as needed for pain. Counterforce brace as directed can help unload area - wear this regularly if it provides you with relief. Home Pronation/supination with 1 pound weight, wrist extension, stretching - do these once a day 3 sets of 10. Consider formal physical therapy. Consider injection for short term pain relief if the above is not helping (typically beneficial if you're only having pain up at the elbow at insertion). Follow up with me in 5-6 weeks for reevaluation.

## 2012-09-22 ENCOUNTER — Encounter: Payer: Self-pay | Admitting: Family Medicine

## 2012-09-22 NOTE — Progress Notes (Signed)
Subjective:    Patient ID: Isaiah Leonard, male    DOB: 04-10-1976, 37 y.o.   MRN: [id number]  PCP: Dr. Rodena Medin  HPI 37 yo M here for right elbow/forearm pain.  Patient denies known injury. He states about 2-3 months ago started to get lateral right elbow pain that has persisted since that time. Associated with dorsal forearm pain especially when lifting items, picking things up, reaching. Was given a medrol dose pack which did help quite a bit but not completely. Pain primarily within forearm now. Worse with turning things like opening doors. Has a pinching sensation at elbow as well. Has not tried any rehab, other things than anti-inflammatories. Right handed.  Past Medical History  Diagnosis Date  . Personal history of other diseases of digestive system   . Pure hypercholesterolemia   . History of shingles   . History of Guillain-Barre syndrome     age 42    Current Outpatient Prescriptions on File Prior to Visit  Medication Sig Dispense Refill  . atorvastatin (LIPITOR) 20 MG tablet Take 1 tablet (20 mg total) by mouth daily.  90 tablet  3  . escitalopram (LEXAPRO) 5 MG tablet Take 1 tablet (5 mg total) by mouth daily.  90 tablet  3    History reviewed. No pertinent past surgical history.  Allergies  Allergen Reactions  . Aspirin   . Penicillins   . Sulfonamide Derivatives     History   Social History  . Marital Status: Married    Spouse Name: N/A    Number of Children: N/A  . Years of Education: N/A   Occupational History  . Not on file.   Social History Main Topics  . Smoking status: Never Smoker   . Smokeless tobacco: Never Used  . Alcohol Use: Yes  . Drug Use: No  . Sexually Active: Not on file   Other Topics Concern  . 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 Diabetes Mother   . Hypertension Mother   . Hyperlipidemia Mother   . Hypertension Father   . Hyperlipidemia Father   . Heart attack Father   . Multiple sclerosis Sister   . Stroke Brother     history of etoh,a nd drug abuse  . Sudden death Neg Hx     BP 143/94  Pulse 87  Ht 5\' 8"  (1.727 m)  Wt 183 lb (83.008 kg)  BMI 27.82 kg/m2  Review of Systems See HPI .    Objective:   Physical Exam Gen: NAD  R elbow: No gross deformity, swelling, bruising. Mild TTP dorsal forearm proximally, less TTP at lateral epicondyle.  No other TTP of elbow including medial epicondyle, biceps tendon, olecranon. FROM without pain.  Mild reproduction of pain with wrist extension, pronation but 5/5 strength. Collateral ligaments intact. NVI distally. Negative tinels at cubital tunnel and carpal tunnel.    Assessment & Plan:  1. Lateral epicondylitis and forearm strain - Start with home exercise program (declined formal PT).  Icing, tylenol, aleve.  Counterforce brace if this provides comfort.  If not improving would consider nitro patches, physical therapy.  His pain is not currently severe at lateral epicondyle - injection usually beneficial when this  is the only area of pain.  F/u in 5-6 weeks for reevaluation.

## 2012-09-22 NOTE — Assessment & Plan Note (Signed)
Lateral epicondylitis and forearm strain - Start with home exercise program (declined formal PT).  Icing, tylenol, aleve.  Counterforce brace if this provides comfort.  If not improving would consider nitro patches, physical therapy.  His pain is not currently severe at lateral epicondyle - injection usually beneficial when this is the only area of pain.  F/u in 5-6 weeks for reevaluation.

## 2012-10-05 ENCOUNTER — Other Ambulatory Visit (INDEPENDENT_AMBULATORY_CARE_PROVIDER_SITE_OTHER): Payer: 59

## 2012-10-05 ENCOUNTER — Telehealth: Payer: Self-pay | Admitting: *Deleted

## 2012-10-05 DIAGNOSIS — E785 Hyperlipidemia, unspecified: Secondary | ICD-10-CM

## 2012-10-05 DIAGNOSIS — R7309 Other abnormal glucose: Secondary | ICD-10-CM

## 2012-10-05 LAB — HEPATIC FUNCTION PANEL: Albumin: 4.3 g/dL (ref 3.5–5.2)

## 2012-10-05 LAB — BASIC METABOLIC PANEL
CO2: 26 mEq/L (ref 19–32)
Chloride: 107 mEq/L (ref 96–112)
Sodium: 140 mEq/L (ref 135–145)

## 2012-10-05 LAB — LIPID PANEL
HDL: 45.1 mg/dL (ref >39.00)
LDL Cholesterol: 111 mg/dL — ABNORMAL HIGH (ref 0–99)
Total CHOL/HDL Ratio: 4
Triglycerides: 109 mg/dL (ref 0.0–149.0)
VLDL: 21.8 mg/dL (ref 0.0–40.0)

## 2012-10-05 LAB — HEMOGLOBIN A1C: Hgb A1c MFr Bld: 5.9 % (ref 4.6–6.5)

## 2012-10-05 NOTE — Telephone Encounter (Signed)
Received call from [facility] that pt presented with an Rx from Dr Rodena Medin for the following labs: Lipid/lfts, hgba1c and chem 7. Orders entered.  Please schedule fasting labs prior to your next appointment  Lipid/lft-272.4 and chem7-hyperglycemia

## 2012-10-12 ENCOUNTER — Telehealth: Payer: Self-pay | Admitting: Family

## 2012-10-12 NOTE — Telephone Encounter (Signed)
Opened in error

## 2012-10-16 ENCOUNTER — Ambulatory Visit (INDEPENDENT_AMBULATORY_CARE_PROVIDER_SITE_OTHER): Payer: 59 | Admitting: Family

## 2012-10-16 ENCOUNTER — Encounter: Payer: Self-pay | Admitting: Family

## 2012-10-16 VITALS — BP 118/88 | HR 73 | Temp 97.8°F | Resp 16 | Wt 180.1 lb

## 2012-10-16 DIAGNOSIS — R739 Hyperglycemia, unspecified: Secondary | ICD-10-CM

## 2012-10-16 DIAGNOSIS — F419 Anxiety disorder, unspecified: Secondary | ICD-10-CM

## 2012-10-16 DIAGNOSIS — F411 Generalized anxiety disorder: Secondary | ICD-10-CM

## 2012-10-16 DIAGNOSIS — E7800 Pure hypercholesterolemia, unspecified: Secondary | ICD-10-CM

## 2012-10-16 DIAGNOSIS — R7309 Other abnormal glucose: Secondary | ICD-10-CM

## 2012-10-16 DIAGNOSIS — M7710 Lateral epicondylitis, unspecified elbow: Secondary | ICD-10-CM

## 2012-10-16 MED ORDER — ESCITALOPRAM OXALATE 5 MG PO TABS: 5.0000 mg | ORAL_TABLET | Freq: Every day | ORAL | Status: DC

## 2012-10-16 MED ORDER — ATORVASTATIN CALCIUM 20 MG PO TABS: 20.0000 mg | ORAL_TABLET | Freq: Every day | ORAL | Status: DC

## 2012-10-16 NOTE — Patient Instructions (Addendum)
Please follow up in 6 months for a physical.  

## 2012-10-16 NOTE — Assessment & Plan Note (Signed)
Lipids stable, lft normal.  Continue statin.

## 2012-10-16 NOTE — Assessment & Plan Note (Signed)
Unchanged. Recommended that he follow back up with Dr. Pearletha Forge.

## 2012-10-16 NOTE — Assessment & Plan Note (Signed)
Stable on lexapro, continue same.  

## 2012-10-16 NOTE — Progress Notes (Signed)
  Subjective:    Patient ID: Isaiah Leonard, male    DOB: 01-Jun-1976, 37 y.o.   MRN: [id number]  HPI  Isaiah Leonard is a 37 yr old male who presents today for follow up of his lab work.  1) hyperlipidemia- he continues liptor.  2) Epicondylitis of elbow- has been following with Dr. Pearletha Forge, reports that he is still having pain despite exercises he has been prescribed.  3) anxiety- reports that this is well controlled on lexapro. Review of Systems     Objective:   Physical Exam  Constitutional: He is oriented to person, place, and time. He appears well-developed and well-nourished. No distress.  Neurological: He is alert and oriented to person, place, and time.  Psychiatric: He has a normal mood and affect. His behavior is normal. Judgment and thought content normal.          Assessment & Plan:

## 2012-10-16 NOTE — Assessment & Plan Note (Signed)
A1C stable at 5.9. Continue diet/exercise.

## 2013-01-04 ENCOUNTER — Ambulatory Visit (INDEPENDENT_AMBULATORY_CARE_PROVIDER_SITE_OTHER): Payer: 59 | Admitting: Internal Medicine

## 2013-01-04 ENCOUNTER — Encounter: Payer: Self-pay | Admitting: Internal Medicine

## 2013-01-04 VITALS — BP 124/82 | HR 72 | Temp 97.5°F | Ht 68.0 in | Wt 180.0 lb

## 2013-01-04 DIAGNOSIS — M5430 Sciatica, unspecified side: Secondary | ICD-10-CM

## 2013-01-04 DIAGNOSIS — M5432 Sciatica, left side: Secondary | ICD-10-CM

## 2013-01-04 MED ORDER — PREDNISONE 10 MG PO TABS: ORAL_TABLET | ORAL | Status: DC

## 2013-01-04 MED ORDER — CYCLOBENZAPRINE HCL 10 MG PO TABS: 10.0000 mg | ORAL_TABLET | Freq: Three times a day (TID) | ORAL | Status: DC | PRN

## 2013-01-04 NOTE — Patient Instructions (Signed)
Sciatica with Rehab The sciatic nerve runs from the back down the leg and is responsible for sensation and control of the muscles in the back (posterior) side of the thigh, lower leg, and foot. Sciatica is a condition that is characterized by inflammation of this nerve.  SYMPTOMS   Signs of nerve damage, including numbness and/or weakness along the posterior side of the lower extremity.  Pain in the back of the thigh that may also travel down the leg.  Pain that worsens when sitting for long periods of time.  Occasionally, pain in the back or buttock. CAUSES  Inflammation of the sciatic nerve is the cause of sciatica. The inflammation is due to something irritating the nerve. Common sources of irritation include:  Sitting for long periods of time.  Direct trauma to the nerve.  Arthritis of the spine.  Herniated or ruptured disk.  Slipping of the vertebrae (spondylolithesis)  Pressure from soft tissues, such as muscles or ligament-like tissue (fascia). RISK INCREASES WITH:  Sports that place pressure or stress on the spine (football or weightlifting).  Poor strength and flexibility.  Failure to warm-up properly before activity.  Family history of low back pain or disk disorders.  Previous back injury or surgery.  Poor body mechanics, especially when lifting, or poor posture. PREVENTION   Warm up and stretch properly before activity.  Maintain physical fitness:  Strength, flexibility, and endurance.  Cardiovascular fitness.  Learn and use proper technique, especially with posture and lifting. When possible, have coach correct improper technique.  Avoid activities that place stress on the spine. PROGNOSIS If treated properly, then sciatica usually resolves within 6 weeks. However, occasionally surgery is necessary.  RELATED COMPLICATIONS   Permanent nerve damage, including pain, numbness, tingle, or weakness.  Chronic back pain.  Risks of surgery: infection,  bleeding, nerve damage, or damage to surrounding tissues. TREATMENT Treatment initially involves resting from any activities that aggravate your symptoms. The use of ice and medication may help reduce pain and inflammation. The use of strengthening and stretching exercises may help reduce pain with activity. These exercises may be performed at home or with referral to a therapist. A therapist may recommend further treatments, such as transcutaneous electronic nerve stimulation (TENS) or ultrasound. Your caregiver may recommend corticosteroid injections to help reduce inflammation of the sciatic nerve. If symptoms persist despite non-surgical (conservative) treatment, then surgery may be recommended. MEDICATION  If pain medication is necessary, then nonsteroidal anti-inflammatory medications, such as aspirin and ibuprofen, or other minor pain relievers, such as acetaminophen, are often recommended.  Do not take pain medication for 7 days before surgery.  Prescription pain relievers may be given if deemed necessary by your caregiver. Use only as directed and only as much as you need.  Ointments applied to the skin may be helpful.  Corticosteroid injections may be given by your caregiver. These injections should be reserved for the most serious cases, because they may only be given a certain number of times. HEAT AND COLD  Cold treatment (icing) relieves pain and reduces inflammation. Cold treatment should be applied for 10 to 15 minutes every 2 to 3 hours for inflammation and pain and immediately after any activity that aggravates your symptoms. Use ice packs or massage the area with a piece of ice (ice massage).  Heat treatment may be used prior to performing the stretching and strengthening activities prescribed by your caregiver, physical therapist, or athletic trainer. Use a heat pack or soak the injury in warm water. SEEK   MEDICAL CARE IF:  Treatment seems to offer no benefit, or the condition  worsens.  Any medications produce adverse side effects. EXERCISES  RANGE OF MOTION (ROM) AND STRETCHING EXERCISES - Sciatica Most people with sciatic will find that their symptoms worsen with either excessive bending forward (flexion) or arching at the low back (extension). The exercises which will help resolve your symptoms will focus on the opposite motion. Your physician, physical therapist or athletic trainer will help you determine which exercises will be most helpful to resolve your low back pain. Do not complete any exercises without first consulting with your clinician. Discontinue any exercises which worsen your symptoms until you speak to your clinician. If you have pain, numbness or tingling which travels down into your buttocks, leg or foot, the goal of the therapy is for these symptoms to move closer to your back and eventually resolve. Occasionally, these leg symptoms will get better, but your low back pain may worsen; this is typically an indication of progress in your rehabilitation. Be certain to be very alert to any changes in your symptoms and the activities in which you participated in the 24 hours prior to the change. Sharing this information with your clinician will allow him/her to most efficiently treat your condition. These exercises may help you when beginning to rehabilitate your injury. Your symptoms may resolve with or without further involvement from your physician, physical therapist or athletic trainer. While completing these exercises, remember:   Restoring tissue flexibility helps normal motion to return to the joints. This allows healthier, less painful movement and activity.  An effective stretch should be held for at least 30 seconds.  A stretch should never be painful. You should only feel a gentle lengthening or release in the stretched tissue. FLEXION RANGE OF MOTION AND STRETCHING EXERCISES: STRETCH  Flexion, Single Knee to Chest   Lie on a firm bed or floor  with both legs extended in front of you.  Keeping one leg in contact with the floor, bring your opposite knee to your chest. Hold your leg in place by either grabbing behind your thigh or at your knee.  Pull until you feel a gentle stretch in your low back. Hold __________ seconds.  Slowly release your grasp and repeat the exercise with the opposite side. Repeat __________ times. Complete this exercise __________ times per day.  STRETCH  Flexion, Double Knee to Chest  Lie on a firm bed or floor with both legs extended in front of you.  Keeping one leg in contact with the floor, bring your opposite knee to your chest.  Tense your stomach muscles to support your back and then lift your other knee to your chest. Hold your legs in place by either grabbing behind your thighs or at your knees.  Pull both knees toward your chest until you feel a gentle stretch in your low back. Hold __________ seconds.  Tense your stomach muscles and slowly return one leg at a time to the floor. Repeat __________ times. Complete this exercise __________ times per day.  STRETCH  Low Trunk Rotation   Lie on a firm bed or floor. Keeping your legs in front of you, bend your knees so they are both pointed toward the ceiling and your feet are flat on the floor.  Extend your arms out to the side. This will stabilize your upper body by keeping your shoulders in contact with the floor.  Gently and slowly drop both knees together to one side until you   feel a gentle stretch in your low back. Hold for __________ seconds.  Tense your stomach muscles to support your low back as you bring your knees back to the starting position. Repeat the exercise to the other side. Repeat __________ times. Complete this exercise __________ times per day  EXTENSION RANGE OF MOTION AND FLEXIBILITY EXERCISES: STRETCH  Extension, Prone on Elbows  Lie on your stomach on the floor, a bed will be too soft. Place your palms about shoulder  width apart and at the height of your head.  Place your elbows under your shoulders. If this is too painful, stack pillows under your chest.  Allow your body to relax so that your hips drop lower and make contact more completely with the floor.  Hold this position for __________ seconds.  Slowly return to lying flat on the floor. Repeat __________ times. Complete this exercise __________ times per day.  RANGE OF MOTION  Extension, Prone Press Ups  Lie on your stomach on the floor, a bed will be too soft. Place your palms about shoulder width apart and at the height of your head.  Keeping your back as relaxed as possible, slowly straighten your elbows while keeping your hips on the floor. You may adjust the placement of your hands to maximize your comfort. As you gain motion, your hands will come more underneath your shoulders.  Hold this position __________ seconds.  Slowly return to lying flat on the floor. Repeat __________ times. Complete this exercise __________ times per day.  STRENGTHENING EXERCISES - Sciatica  These exercises may help you when beginning to rehabilitate your injury. These exercises should be done near your "sweet spot." This is the neutral, low-back arch, somewhere between fully rounded and fully arched, that is your least painful position. When performed in this safe range of motion, these exercises can be used for people who have either a flexion or extension based injury. These exercises may resolve your symptoms with or without further involvement from your physician, physical therapist or athletic trainer. While completing these exercises, remember:   Muscles can gain both the endurance and the strength needed for everyday activities through controlled exercises.  Complete these exercises as instructed by your physician, physical therapist or athletic trainer. Progress with the resistance and repetition exercises only as your caregiver advises.  You may  experience muscle soreness or fatigue, but the pain or discomfort you are trying to eliminate should never worsen during these exercises. If this pain does worsen, stop and make certain you are following the directions exactly. If the pain is still present after adjustments, discontinue the exercise until you can discuss the trouble with your clinician. STRENGTHENING Deep Abdominals, Pelvic Tilt   Lie on a firm bed or floor. Keeping your legs in front of you, bend your knees so they are both pointed toward the ceiling and your feet are flat on the floor.  Tense your lower abdominal muscles to press your low back into the floor. This motion will rotate your pelvis so that your tail bone is scooping upwards rather than pointing at your feet or into the floor.  With a gentle tension and even breathing, hold this position for __________ seconds. Repeat __________ times. Complete this exercise __________ times per day.  STRENGTHENING  Abdominals, Crunches   Lie on a firm bed or floor. Keeping your legs in front of you, bend your knees so they are both pointed toward the ceiling and your feet are flat on the floor. Cross   your arms over your chest.  Slightly tip your chin down without bending your neck.  Tense your abdominals and slowly lift your trunk high enough to just clear your shoulder blades. Lifting higher can put excessive stress on the low back and does not further strengthen your abdominal muscles.  Control your return to the starting position. Repeat __________ times. Complete this exercise __________ times per day.  STRENGTHENING  Quadruped, Opposite UE/LE Lift  Assume a hands and knees position on a firm surface. Keep your hands under your shoulders and your knees under your hips. You may place padding under your knees for comfort.  Find your neutral spine and gently tense your abdominal muscles so that you can maintain this position. Your shoulders and hips should form a rectangle that  is parallel with the floor and is not twisted.  Keeping your trunk steady, lift your right hand no higher than your shoulder and then your left leg no higher than your hip. Make sure you are not holding your breath. Hold this position __________ seconds.  Continuing to keep your abdominal muscles tense and your back steady, slowly return to your starting position. Repeat with the opposite arm and leg. Repeat __________ times. Complete this exercise __________ times per day.  STRENGTHENING  Abdominals and Quadriceps, Straight Leg Raise   Lie on a firm bed or floor with both legs extended in front of you.  Keeping one leg in contact with the floor, bend the other knee so that your foot can rest flat on the floor.  Find your neutral spine, and tense your abdominal muscles to maintain your spinal position throughout the exercise.  Slowly lift your straight leg off the floor about 6 inches for a count of 15, making sure to not hold your breath.  Still keeping your neutral spine, slowly lower your leg all the way to the floor. Repeat this exercise with each leg __________ times. Complete this exercise __________ times per day. POSTURE AND BODY MECHANICS CONSIDERATIONS - Sciatica Keeping correct posture when sitting, standing or completing your activities will reduce the stress put on different body tissues, allowing injured tissues a chance to heal and limiting painful experiences. The following are general guidelines for improved posture. Your physician or physical therapist will provide you with any instructions specific to your needs. While reading these guidelines, remember:  The exercises prescribed by your provider will help you have the flexibility and strength to maintain correct postures.  The correct posture provides the optimal environment for your joints to work. All of your joints have less wear and tear when properly supported by a spine with good posture. This means you will  experience a healthier, less painful body.  Correct posture must be practiced with all of your activities, especially prolonged sitting and standing. Correct posture is as important when doing repetitive low-stress activities (typing) as it is when doing a single heavy-load activity (lifting). RESTING POSITIONS Consider which positions are most painful for you when choosing a resting position. If you have pain with flexion-based activities (sitting, bending, stooping, squatting), choose a position that allows you to rest in a less flexed posture. You would want to avoid curling into a fetal position on your side. If your pain worsens with extension-based activities (prolonged standing, working overhead), avoid resting in an extended position such as sleeping on your stomach. Most people will find more comfort when they rest with their spine in a more neutral position, neither too rounded nor too arched. Lying   on a non-sagging bed on your side with a pillow between your knees, or on your back with a pillow under your knees will often provide some relief. Keep in mind, being in any one position for a prolonged period of time, no matter how correct your posture, can still lead to stiffness. PROPER SITTING POSTURE In order to minimize stress and discomfort on your spine, you must sit with correct posture Sitting with good posture should be effortless for a healthy body. Returning to good posture is a gradual process. Many people can work toward this most comfortably by using various supports until they have the flexibility and strength to maintain this posture on their own. When sitting with proper posture, your ears will fall over your shoulders and your shoulders will fall over your hips. You should use the back of the chair to support your upper back. Your low back will be in a neutral position, just slightly arched. You may place a small pillow or folded towel at the base of your low back for support.  When  working at a desk, create an environment that supports good, upright posture. Without extra support, muscles fatigue and lead to excessive strain on joints and other tissues. Keep these recommendations in mind: CHAIR:   A chair should be able to slide under your desk when your back makes contact with the back of the chair. This allows you to work closely.  The chair's height should allow your eyes to be level with the upper part of your monitor and your hands to be slightly lower than your elbows. BODY POSITION  Your feet should make contact with the floor. If this is not possible, use a foot rest.  Keep your ears over your shoulders. This will reduce stress on your neck and low back. INCORRECT SITTING POSTURES   If you are feeling tired and unable to assume a healthy sitting posture, do not slouch or slump. This puts excessive strain on your back tissues, causing more damage and pain. Healthier options include:  Using more support, like a lumbar pillow.  Switching tasks to something that requires you to be upright or walking.  Talking a brief walk.  Lying down to rest in a neutral-spine position. PROLONGED STANDING WHILE SLIGHTLY LEANING FORWARD  When completing a task that requires you to lean forward while standing in one place for a long time, place either foot up on a stationary 2-4 inch high object to help maintain the best posture. When both feet are on the ground, the low back tends to lose its slight inward curve. If this curve flattens (or becomes too large), then the back and your other joints will experience too much stress, fatigue more quickly and can cause pain.  CORRECT STANDING POSTURES Proper standing posture should be assumed with all daily activities, even if they only take a few moments, like when brushing your teeth. As in sitting, your ears should fall over your shoulders and your shoulders should fall over your hips. You should keep a slight tension in your abdominal  muscles to brace your spine. Your tailbone should point down to the ground, not behind your body, resulting in an over-extended swayback posture.  INCORRECT STANDING POSTURES  Common incorrect standing postures include a forward head, locked knees and/or an excessive swayback. WALKING Walk with an upright posture. Your ears, shoulders and hips should all line-up. PROLONGED ACTIVITY IN A FLEXED POSITION When completing a task that requires you to bend forward at your   waist or lean over a low surface, try to find a way to stabilize 3 of 4 of your limbs. You can place a hand or elbow on your thigh or rest a knee on the surface you are reaching across. This will provide you more stability so that your muscles do not fatigue as quickly. By keeping your knees relaxed, or slightly bent, you will also reduce stress across your low back. CORRECT LIFTING TECHNIQUES DO :   Assume a wide stance. This will provide you more stability and the opportunity to get as close as possible to the object which you are lifting.  Tense your abdominals to brace your spine; then bend at the knees and hips. Keeping your back locked in a neutral-spine position, lift using your leg muscles. Lift with your legs, keeping your back straight.  Test the weight of unknown objects before attempting to lift them.  Try to keep your elbows locked down at your sides in order get the best strength from your shoulders when carrying an object.  Always ask for help when lifting heavy or awkward objects. INCORRECT LIFTING TECHNIQUES DO NOT:   Lock your knees when lifting, even if it is a small object.  Bend and twist. Pivot at your feet or move your feet when needing to change directions.  Assume that you cannot safely pick up a paperclip without proper posture. Document Released: 08/05/2005 Document Revised: 10/28/2011 Document Reviewed: 11/17/2008 [employer] Patient Information 2013 [employer].  

## 2013-01-04 NOTE — Progress Notes (Signed)
Subjective:    Patient ID: Isaiah Leonard, male    DOB: [birth year], 37 y.o.   MRN: [id number]  HPI  Pt presents to the clinic today with c/o lower back pain that radiates down into his left leg. This started 2 days ago. He has been doing some work out in the yard. He denies a specific injury to the area. He has never had pain like this before. He has taken tylenol for the pain. Pt denies loss of bowel or bladder.  Review of Systems      Past Medical History  Diagnosis Date  . Personal history of other diseases of digestive system   . Pure hypercholesterolemia   . History of shingles   . History of Guillain-Barre syndrome     age 7    Current Outpatient Prescriptions  Medication Sig Dispense Refill  . atorvastatin (LIPITOR) 20 MG tablet Take 1 tablet (20 mg total) by mouth daily.  90 tablet  1  . escitalopram (LEXAPRO) 5 MG tablet Take 1 tablet (5 mg total) by mouth daily.  90 tablet  1   No current facility-administered medications for this visit.    Allergies  Allergen Reactions  . Aspirin   . Penicillins   . Sulfonamide Derivatives     Family History  Problem Relation Age of Onset  . Diabetes Mother   . Hypertension Mother   . Hyperlipidemia Mother   . Hypertension Father   . Hyperlipidemia Father   . Heart attack Father   . Multiple sclerosis Sister   . Stroke Brother     history of etoh,a nd drug abuse  . Sudden death Neg Hx     History   Social History  . Marital Status: Married    Spouse Name: N/A    Number of Children: N/A  . Years of Education: N/A   Occupational History  . Not on file.   Social History Main Topics  . Smoking status: Never Smoker   . Smokeless tobacco: Never Used  . Alcohol Use: Yes  . Drug Use: No  . Sexually Active: Not on file   Other Topics Concern  . 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  Musculoskeletal: Pt reports low back pain. Denies decrease in range of motion, difficulty with gait,  or joint pain and swelling.  Neurological: Pt reports sharp shooting pain in his leg. Denies dizziness, difficulty with memory, difficulty with speech or problems with balance and coordination.   No other specific complaints in a complete review of systems (except as listed in HPI above).  Objective:   Physical Exam   BP 124/82  Pulse 72  Temp(Src) 97.5 F (36.4 C) (Oral)  Ht 5\' 8"  (1.727 m)  Wt 180 lb (81.647 kg)  BMI 27.38 kg/m2  SpO2 96% Wt Readings from Last 3 Encounters:  01/04/13 180 lb (81.647 kg)  10/16/12 180 lb 1.9 oz (81.702 kg)  09/22/12 183 lb (83.008 kg)    General: Appears his stated age, well developed, well nourished in NAD. Cardiovascular:  Normal rate and rhythm. S1,S2 noted.  No murmur, rubs or gallops noted. No JVD or BLE edema. No carotid bruits noted. Pulmonary/Chest: Normal effort and positive vesicular breath sounds. No respiratory distress. No wheezes, rales or ronchi noted.  Musculoskeletal: Normal range of motion. No signs of joint swelling. No difficulty with gait.  Neurological: Alert and oriented. Cranial nerves II-XII intact. Coordination normal. +DTRs bilaterally. Positive straight leg raise.       Assessment & Plan:   Sciatica neuralgia of left side, new onset:  eRx for steroid taper eRx for flexeril Perform stretching exercise as indicated on handout  RTC as needed or if symptoms persist or worsen

## 2013-01-13 ENCOUNTER — Ambulatory Visit: Payer: 59 | Admitting: Internal Medicine

## 2013-02-17 ENCOUNTER — Encounter: Payer: Self-pay | Admitting: Family

## 2013-02-17 ENCOUNTER — Ambulatory Visit (INDEPENDENT_AMBULATORY_CARE_PROVIDER_SITE_OTHER): Payer: 59 | Admitting: Family

## 2013-02-17 VITALS — BP 124/86 | HR 81 | Temp 97.1°F | Resp 16 | Ht 70.0 in | Wt 181.0 lb

## 2013-02-17 DIAGNOSIS — M545 Low back pain, unspecified: Secondary | ICD-10-CM | POA: Insufficient documentation

## 2013-02-17 DIAGNOSIS — F419 Anxiety disorder, unspecified: Secondary | ICD-10-CM

## 2013-02-17 DIAGNOSIS — F411 Generalized anxiety disorder: Secondary | ICD-10-CM

## 2013-02-17 NOTE — Assessment & Plan Note (Signed)
Trial of meloxicam 7.5 mg once daily, rx provided for #20 no refills.  Refer to PT.  If symptoms worsen, or if no improvement plan MRI of the lumbar spine.

## 2013-02-17 NOTE — Assessment & Plan Note (Signed)
Deteriorated. Increase lexapro from 5mg  to 10 mg.  Follow up in 2 months. Rx provided for #30 tabs with 2 refills.

## 2013-02-17 NOTE — Patient Instructions (Addendum)
You will be contacted about your referral to physical therapy. Call if back pain worsens, if you develop leg numbness/weakness. Increase lexapro to 10mg  daily. Follow up in 2 months.

## 2013-02-17 NOTE — Progress Notes (Signed)
Subjective:    Patient ID: Isaiah Leonard, male    DOB: 03-21-1976, 37 y.o.   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  Isaiah Leonard more nervous.  Feel like OCD has been bothering him.   Back pain- reports that a few weeks ago he bent at the back.  Was placed on flexeril and prednisone.  Helped some.  This past week noticed discomfort aching i the lower back that goes across to the left side of his buttocks.  Intermittent.  Wakes up and feels fine, but as the pain progresses symptoms worsen.   Review of Systems See hpi  Past Medical History  Diagnosis Date  . Personal history of other diseases of digestive system   . Pure hypercholesterolemia   . History of shingles   . History of Guillain-Barre syndrome     age 67    History   Social History  . Marital Status: Married    Spouse Name: N/A    Number of Children: N/A  . Years of Education: N/A   Occupational History  . Not on file.   Social History Leonard Topics  . Smoking status: Never Smoker   . Smokeless tobacco: Never Used  . Alcohol Use: Yes  . Drug Use: No  . Sexually Active: Not on file   Other Topics Concern  . 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  Family History  Problem Relation Age of Onset  . Diabetes Mother   . Hypertension Mother   . Hyperlipidemia Mother   . Hypertension Father   . Hyperlipidemia Father   . Heart attack Father   . Multiple sclerosis Sister   . Stroke Brother     history of etoh,a nd drug abuse  . Sudden death Neg Hx     Allergies  Allergen Reactions  . Aspirin   . Codeine   . Penicillins   . Sulfonamide Derivatives     Current Outpatient Prescriptions on File Prior to  Visit  Medication Sig Dispense Refill  . atorvastatin (LIPITOR) 20 MG tablet Take 1 tablet (20 mg total) by mouth daily.  90 tablet  1  . [DISCONTINUED] escitalopram (LEXAPRO) 5 MG tablet Take 1 tablet (5 mg total) by mouth daily.  90 tablet  1   No current facility-administered medications on file prior to visit.    BP 124/86  Pulse 81  Temp(Src) 97.1 F (36.2 C) (Oral)  Resp 16  Ht 5\' 10"  (1.778 m)  Wt 181 lb 0.6 oz (82.119 kg)  BMI 25.98 kg/m2  SpO2 99%       Objective:   Physical Exam  Constitutional: He is oriented to person, place, and time. He appears well-developed and well-nourished. No distress.  HENT:  Head: Normocephalic.  Left Ear: External ear normal.  Cardiovascular: Normal rate and regular rhythm.   No murmur heard. Pulmonary/Chest: Effort normal and breath sounds normal. No respiratory distress. He has no wheezes. He has no rales. He exhibits no tenderness.  Abdominal: Soft. Bowel sounds are normal.  Musculoskeletal:  Cervical back: He exhibits no tenderness.       Thoracic back: He exhibits no tenderness.       Lumbar back: He exhibits no tenderness.  Bilateral LE strength is 5/5  Neurological: He is alert and oriented to person, place, and time.  Psychiatric: He has a normal mood and affect. His behavior is normal. Judgment and thought content normal.          Assessment & Plan:

## 2013-02-23 ENCOUNTER — Ambulatory Visit (INDEPENDENT_AMBULATORY_CARE_PROVIDER_SITE_OTHER)
Admission: RE | Admit: 2013-02-23 | Discharge: 2013-02-23 | Disposition: A | Discharge status: Home / Self Care | Payer: 59 | Source: Ambulatory Visit | Attending: Internal Medicine | Admitting: Internal Medicine

## 2013-02-23 ENCOUNTER — Encounter: Payer: Self-pay | Admitting: Internal Medicine

## 2013-02-23 ENCOUNTER — Ambulatory Visit: Payer: 59 | Admitting: Internal Medicine

## 2013-02-23 ENCOUNTER — Ambulatory Visit (INDEPENDENT_AMBULATORY_CARE_PROVIDER_SITE_OTHER): Payer: 59 | Admitting: Internal Medicine

## 2013-02-23 VITALS — BP 142/88 | HR 75 | Temp 98.1°F | Ht 68.0 in | Wt 180.0 lb

## 2013-02-23 DIAGNOSIS — M5432 Sciatica, left side: Secondary | ICD-10-CM

## 2013-02-23 DIAGNOSIS — M5430 Sciatica, unspecified side: Secondary | ICD-10-CM

## 2013-02-23 NOTE — Progress Notes (Signed)
Subjective:    Patient ID: Isaiah Leonard, male    DOB: 1976-01-11, 37 y.o.   MRN: [id number]  HPI  Pt presents to the clinic today with c/o lower back pain that radiates down into his left leg. This episode started 5 days ago. He has been playing with his neices and nephews prior to this episode.He denies any specific injury to the area. He has had episodes of sciatica in the past. He is currently taking Mobic which does seem to help. He has never had a xray of the lumbar spine. Pt denies loss of bowel or bladder.  Review of Systems      Past Medical History  Diagnosis Date  . Personal history of other diseases of digestive system   . Pure hypercholesterolemia   . History of shingles   . History of Guillain-Barre syndrome     age 58    Current Outpatient Prescriptions  Medication Sig Dispense Refill  . atorvastatin (LIPITOR) 20 MG tablet Take 1 tablet (20 mg total) by mouth daily.  90 tablet  1  . escitalopram (LEXAPRO) 10 MG tablet Take 10 mg by mouth daily.      . meloxicam (MOBIC) 7.5 MG tablet Take 7.5 mg by mouth daily.       No current facility-administered medications for this visit.    Allergies  Allergen Reactions  . Aspirin   . Codeine   . Penicillins   . Sulfonamide Derivatives     Family History  Problem Relation Age of Onset  . Diabetes Mother   . Hypertension Mother   . Hyperlipidemia Mother   . Hypertension Father   . Hyperlipidemia Father   . Heart attack Father   . Multiple sclerosis Sister   . Stroke Brother     history of etoh,a nd drug abuse  . Sudden death Neg Hx     History   Social History  . Marital Status: Married    Spouse Name: N/A    Number of Children: N/A  . Years of Education: N/A   Occupational History  . Not on file.   Social History Main Topics  . Smoking status: Never Smoker   . Smokeless tobacco: Never Used  . Alcohol Use: Yes  . Drug Use: No  . Sexually Active: Not on file   Other Topics Concern  . 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  Musculoskeletal: Pt reports low back pain. Denies decrease in range of motion, difficulty with gait,  or joint pain and swelling.  Neurological: Pt reports sharp shooting pain in his leg. Denies dizziness, difficulty with memory, difficulty with speech or problems with balance and coordination.   No other specific complaints in a complete review of systems (except as listed in HPI above).  Objective:   Physical Exam   BP 142/88  Pulse 75  Temp(Src) 98.1 F (36.7 C) (Oral)  Ht 5\' 8"  (1.727 m)  Wt 180 lb (81.647 kg)  BMI 27.38 kg/m2  SpO2 96% Wt Readings from Last 3 Encounters:  02/23/13 180 lb (81.647 kg)  02/17/13 181 lb 0.6 oz (82.119 kg)  01/04/13 180 lb (81.647 kg)    General: Appears his stated age, well developed, well nourished in NAD. Cardiovascular: Normal rate and rhythm. S1,S2 noted.  No murmur, rubs or gallops noted. No JVD or BLE edema. No carotid bruits noted. Pulmonary/Chest: Normal effort and positive vesicular breath sounds. No respiratory distress. No wheezes, rales or ronchi noted.  Musculoskeletal: Normal range of motion. No signs of joint swelling. No difficulty with gait.  Neurological: Alert and oriented. Cranial nerves II-XII intact. Coordination normal. +DTRs bilaterally. Positive straight leg raise.       Assessment & Plan:   Sciatica neuralgia of left side, recurrent:  Continue Mobic Will obtain xray of lumbar spine today If needs referral to neurosurgery, would prefer Dr. Danielle Dess or Dr. Bettina Gavia Perform stretching exercise as indicated on handout  RTC as needed or if symptoms  persist or worsen

## 2013-02-23 NOTE — Patient Instructions (Signed)

## 2013-02-24 ENCOUNTER — Other Ambulatory Visit: Payer: Self-pay | Admitting: Internal Medicine

## 2013-02-24 DIAGNOSIS — M5432 Sciatica, left side: Secondary | ICD-10-CM

## 2013-02-25 ENCOUNTER — Other Ambulatory Visit (HOSPITAL_COMMUNITY): Payer: Self-pay | Admitting: Neurosurgery

## 2013-02-25 DIAGNOSIS — IMO0002 Reserved for concepts with insufficient information to code with codable children: Secondary | ICD-10-CM

## 2013-02-25 DIAGNOSIS — M5416 Radiculopathy, lumbar region: Secondary | ICD-10-CM

## 2013-02-26 ENCOUNTER — Ambulatory Visit (HOSPITAL_COMMUNITY)
Admission: RE | Admit: 2013-02-26 | Discharge: 2013-02-26 | Disposition: A | Discharge status: Home / Self Care | Payer: 59 | Source: Ambulatory Visit | Attending: Diagnostic Radiology | Admitting: Diagnostic Radiology

## 2013-02-26 ENCOUNTER — Other Ambulatory Visit (HOSPITAL_COMMUNITY): Payer: Self-pay | Admitting: Diagnostic Radiology

## 2013-02-26 ENCOUNTER — Ambulatory Visit (HOSPITAL_COMMUNITY)
Admission: RE | Admit: 2013-02-26 | Discharge: 2013-02-26 | Disposition: A | Discharge status: Home / Self Care | Payer: 59 | Source: Ambulatory Visit | Attending: Neurosurgery | Admitting: Neurosurgery

## 2013-02-26 DIAGNOSIS — Z9889 Other specified postprocedural states: Secondary | ICD-10-CM

## 2013-02-26 DIAGNOSIS — Z1389 Encounter for screening for other disorder: Secondary | ICD-10-CM | POA: Insufficient documentation

## 2013-02-26 DIAGNOSIS — M5126 Other intervertebral disc displacement, lumbar region: Secondary | ICD-10-CM | POA: Insufficient documentation

## 2013-02-26 DIAGNOSIS — M5416 Radiculopathy, lumbar region: Secondary | ICD-10-CM

## 2013-02-26 DIAGNOSIS — IMO0002 Reserved for concepts with insufficient information to code with codable children: Secondary | ICD-10-CM

## 2013-06-24 ENCOUNTER — Other Ambulatory Visit: Payer: Self-pay

## 2013-07-22 ENCOUNTER — Encounter: Payer: Self-pay | Admitting: Internal Medicine

## 2013-07-23 ENCOUNTER — Other Ambulatory Visit: Payer: Self-pay | Admitting: Family

## 2013-07-25 MED ORDER — ESCITALOPRAM OXALATE 10 MG PO TABS: 10.0000 mg | ORAL_TABLET | Freq: Every day | ORAL | Status: DC

## 2013-08-25 ENCOUNTER — Other Ambulatory Visit: Payer: Self-pay | Admitting: Family

## 2013-08-26 NOTE — Telephone Encounter (Signed)
Rx request to pharmacy/SLS **PATIENT'S PCP NO LONGER IN [facility]-MUST REESTABLISH WITH NEW PHYSICIAN PRIOR TO ANY FUTURE REFILLS, 30-Day Supply Only**

## 2013-10-27 ENCOUNTER — Other Ambulatory Visit: Payer: Self-pay | Admitting: Family Medicine

## 2013-10-27 MED ORDER — ATORVASTATIN CALCIUM 20 MG PO TABS: ORAL_TABLET | ORAL | Status: DC

## 2013-11-09 ENCOUNTER — Other Ambulatory Visit: Payer: Self-pay | Admitting: Family

## 2013-11-09 ENCOUNTER — Telehealth: Payer: Self-pay | Admitting: *Deleted

## 2013-11-09 ENCOUNTER — Encounter: Payer: Self-pay | Admitting: Family

## 2013-11-09 ENCOUNTER — Ambulatory Visit (INDEPENDENT_AMBULATORY_CARE_PROVIDER_SITE_OTHER): Payer: 59 | Admitting: Family

## 2013-11-09 VITALS — BP 118/80 | HR 74 | Temp 97.6°F | Resp 16 | Ht 68.5 in | Wt 185.1 lb

## 2013-11-09 DIAGNOSIS — E7800 Pure hypercholesterolemia, unspecified: Secondary | ICD-10-CM

## 2013-11-09 DIAGNOSIS — F411 Generalized anxiety disorder: Secondary | ICD-10-CM

## 2013-11-09 DIAGNOSIS — Z0000 Encounter for general adult medical examination without abnormal findings: Secondary | ICD-10-CM

## 2013-11-09 DIAGNOSIS — Z8249 Family history of ischemic heart disease and other diseases of the circulatory system: Secondary | ICD-10-CM

## 2013-11-09 DIAGNOSIS — F419 Anxiety disorder, unspecified: Secondary | ICD-10-CM

## 2013-11-09 LAB — CBC WITH DIFFERENTIAL/PLATELET
BASOS PCT: 1 % (ref 0–1)
Basophils Absolute: 0.1 10*3/uL (ref 0.0–0.1)
EOS ABS: 0.2 10*3/uL (ref 0.0–0.7)
Eosinophils Relative: 3 % (ref 0–5)
HEMATOCRIT: 41.6 % (ref 39.0–52.0)
HEMOGLOBIN: 14.4 g/dL (ref 13.0–17.0)
LYMPHS ABS: 1.3 10*3/uL (ref 0.7–4.0)
Lymphocytes Relative: 22 % (ref 12–46)
MCH: 28.4 pg (ref 26.0–34.0)
MCHC: 34.6 g/dL (ref 30.0–36.0)
MCV: 82.1 fL (ref 78.0–100.0)
MONO ABS: 0.5 10*3/uL (ref 0.1–1.0)
MONOS PCT: 8 % (ref 3–12)
NEUTROS PCT: 66 % (ref 43–77)
Neutro Abs: 3.8 10*3/uL (ref 1.7–7.7)
Platelets: 197 10*3/uL (ref 150–400)
RBC: 5.07 MIL/uL (ref 4.22–5.81)
RDW: 13.9 % (ref 11.5–15.5)
WBC: 5.7 10*3/uL (ref 4.0–10.5)

## 2013-11-09 LAB — LIPID PANEL
CHOL/HDL RATIO: 3.5 ratio
CHOLESTEROL: 174 mg/dL (ref 0–200)
HDL: 50 mg/dL (ref >39)
LDL CALC: 99 mg/dL (ref 0–99)
Triglycerides: 123 mg/dL (ref <150)
VLDL: 25 mg/dL (ref 0–40)

## 2013-11-09 LAB — BASIC METABOLIC PANEL WITH GFR
BUN: 14 mg/dL (ref 6–23)
CHLORIDE: 104 meq/L (ref 96–112)
CO2: 27 meq/L (ref 19–32)
Calcium: 9.2 mg/dL (ref 8.4–10.5)
Creat: 0.8 mg/dL (ref 0.50–1.35)
GFR, Est African American: >89 mL/min
GFR, Est Non African American: >89 mL/min
GLUCOSE: 102 mg/dL — AB (ref 70–99)
POTASSIUM: 4.2 meq/L (ref 3.5–5.3)
SODIUM: 140 meq/L (ref 135–145)

## 2013-11-09 LAB — HEPATIC FUNCTION PANEL
ALBUMIN: 4.5 g/dL (ref 3.5–5.2)
ALT: 42 U/L (ref 0–53)
AST: 26 U/L (ref 0–37)
Alkaline Phosphatase: 46 U/L (ref 39–117)
BILIRUBIN INDIRECT: 0.5 mg/dL (ref 0.2–1.2)
Bilirubin, Direct: 0.1 mg/dL (ref 0.0–0.3)
Total Bilirubin: 0.6 mg/dL (ref 0.2–1.2)
Total Protein: 6.6 g/dL (ref 6.0–8.3)

## 2013-11-09 LAB — HEMOGLOBIN A1C
HEMOGLOBIN A1C: 6 % — AB (ref <5.7)
Mean Plasma Glucose: 126 mg/dL — ABNORMAL HIGH (ref <117)

## 2013-11-09 LAB — TSH: TSH: 0.619 u[IU]/mL (ref 0.350–4.500)

## 2013-11-09 NOTE — Progress Notes (Signed)
Pre visit review using our clinic review tool, if applicable. No additional management support is needed unless otherwise documented below in the visit note. 

## 2013-11-09 NOTE — Assessment & Plan Note (Signed)
Discussed healthy diet, exercise.

## 2013-11-09 NOTE — Telephone Encounter (Signed)
Message copied by Ronny Flurry on Tue Nov 09, 2013 10:31 AM ------      Message from: O'SULLIVAN, MELISSA      Created: Tue Nov 09, 2013  9:21 AM       Could you pls see if lab can add on a1c to today's blood draw, dx hyperglycemia. thanks ------

## 2013-11-09 NOTE — Telephone Encounter (Signed)
Order has been added per Katrina Engineer, civil (consulting)).

## 2013-11-09 NOTE — Assessment & Plan Note (Signed)
Tolerating statin. Obtain follow up LFT, follow up Lipid panel.

## 2013-11-09 NOTE — Assessment & Plan Note (Signed)
He is requesting referral to cardiology.

## 2013-11-09 NOTE — Assessment & Plan Note (Signed)
Stable on lexapro, continue same.

## 2013-11-09 NOTE — Progress Notes (Signed)
Subjective:    Patient ID: Isaiah Leonard, male    DOB: [birth year], 38 y.o.   MRN: [id number]  HPI  Isaiah Leonard is a 38 yr old male who presents today for cpx.  Immunizations: up to date Diet: could improve- reports fair diet.   Exercise: very little  Hyperlipidemia-continues lipitor.  Denies myalgia.    Anxiety- reports anxiety well controlled. Denies depression.     Family hx CAD- Dad, late 52's.   Review of Systems  Constitutional:       Reports stable weight  HENT: Positive for postnasal drip. Negative for dental problem and hearing loss.   Eyes: Negative for visual disturbance.  Respiratory: Negative for cough and shortness of breath.   Cardiovascular: Negative for chest pain.  Gastrointestinal: Negative for nausea, vomiting and diarrhea.  Genitourinary: Negative for dysuria, frequency and hematuria.  Musculoskeletal:       Reports some mild chronic back pain- worse at the end of the day  Skin: Negative for rash.  Neurological: Negative for headaches.  Hematological: Negative for adenopathy.  Psychiatric/Behavioral:       See HPI   Past Medical History  Diagnosis Date  . Personal history of other diseases of digestive system   . Pure hypercholesterolemia   . History of shingles   . History of Guillain-Barre syndrome     age 41    History   Social History  . Marital Status: Married    Spouse Name: N/A    Number of Children: N/A  . Years of Education: N/A   Occupational History  . Not on file.   Social History Main Topics  . Smoking status: Never Smoker   . Smokeless tobacco: Never Used  . Alcohol Use: Yes  . Drug Use: No  . Sexual Activity: Not on file   Other Topics Concern  . 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 Deviated septum repair  1991    Family History  Problem Relation Age of Onset  . Diabetes Mother   . Hypertension Mother   . Hyperlipidemia Mother   . Hypertension Father   . Hyperlipidemia Father   . Heart attack Father   . Multiple sclerosis Sister   . Stroke Brother     history of etoh,a nd drug abuse  . Sudden death Neg Hx     Allergies  Allergen Reactions  . Aspirin   . Codeine   . Penicillins   . Sulfonamide Derivatives     Current Outpatient Prescriptions on File Prior to Visit  Medication Sig Dispense Refill  . atorvastatin (LIPITOR) 20 MG tablet TAKE 1 TABLET BY MOUTH DAILY.  30 tablet  0  . escitalopram (LEXAPRO) 10 MG tablet Take 1 tablet (10 mg total) by mouth daily.  90 tablet  2  . meloxicam (MOBIC) 7.5 MG tablet Take 7.5 mg by mouth daily.       No current facility-administered medications on file prior to visit.    BP 118/80  Pulse 74  Temp(Src) 97.6 F (  36.4 C) (Oral)  Resp 16  Ht 5' 8.5" (1.74 m)  Wt 185 lb 1.3 oz (83.952 kg)  BMI 27.73 kg/m2  SpO2 99%       Objective:   Physical Exam Physical Exam  Constitutional: He is oriented to person, place, and time. He appears well-developed and well-nourished. No distress.  HENT:  Head: Normocephalic and atraumatic.  Right Ear: Tympanic membrane and ear canal normal.  Left Ear: Tympanic membrane and ear canal normal.  Mouth/Throat: Oropharynx is clear and moist.  Eyes: Pupils are equal, round, and reactive to light. No scleral icterus.  Neck: Normal range of motion. No thyromegaly present.  Cardiovascular: Normal rate and regular rhythm.   No murmur heard. Pulmonary/Chest: Effort normal and breath sounds normal. No respiratory distress. He has no wheezes. He has no rales. He exhibits no tenderness.  Abdominal: Soft. Bowel sounds are normal. He exhibits no distension and no mass. There is no tenderness.  There is no rebound and no guarding.  Musculoskeletal: He exhibits no edema.  Lymphadenopathy:    He has no cervical adenopathy.  Neurological: He is alert and oriented to person, place, and time. He has normal patellar reflexes. He exhibits normal muscle tone. Coordination normal.  Skin: Skin is warm and dry.  Psychiatric: He has a normal mood and affect. His behavior is normal. Judgment and thought content normal.          Assessment & Plan:          Assessment & Plan:

## 2013-11-09 NOTE — Patient Instructions (Signed)
Please complete lab work prior to leaving. Follow up in 6 months, sooner if problems or concerns. Work on [employer], regular exercise. You will be contacted about your referral to neurology.

## 2013-11-10 ENCOUNTER — Telehealth: Payer: Self-pay | Admitting: Family

## 2013-11-10 ENCOUNTER — Encounter: Payer: Self-pay | Admitting: Family

## 2013-11-10 LAB — URINALYSIS, ROUTINE W REFLEX MICROSCOPIC
BILIRUBIN URINE: NEGATIVE
Glucose, UA: NEGATIVE mg/dL
Hgb urine dipstick: NEGATIVE
KETONES UR: NEGATIVE mg/dL
Leukocytes, UA: NEGATIVE
Nitrite: NEGATIVE
Protein, ur: NEGATIVE mg/dL
Specific Gravity, Urine: 1.023 (ref 1.005–1.030)
UROBILINOGEN UA: 0.2 mg/dL (ref 0.0–1.0)
pH: 6.5 (ref 5.0–8.0)

## 2013-11-10 NOTE — Telephone Encounter (Signed)
Please call pt and let him know that his cholesterol looks great.  I did some research and I cannot find a reason why aspirin is contraindicated with history of guillan barre syndrome.  I think that he should start aspirin 58m once daily for cardiac prevention.  Make sure someone is around when he takes his first dose.

## 2013-11-12 NOTE — Telephone Encounter (Signed)
Notified pt and he voices understanding.

## 2013-11-30 ENCOUNTER — Other Ambulatory Visit: Payer: Self-pay | Admitting: Family

## 2013-11-30 MED ORDER — ATORVASTATIN CALCIUM 20 MG PO TABS: ORAL_TABLET | ORAL | Status: DC

## 2013-12-15 ENCOUNTER — Encounter: Payer: Self-pay | Admitting: Cardiology

## 2013-12-15 ENCOUNTER — Ambulatory Visit (INDEPENDENT_AMBULATORY_CARE_PROVIDER_SITE_OTHER): Payer: 59 | Admitting: Cardiology

## 2013-12-15 VITALS — BP 116/82 | HR 80 | Ht 68.5 in | Wt 186.0 lb

## 2013-12-15 DIAGNOSIS — Z8249 Family history of ischemic heart disease and other diseases of the circulatory system: Secondary | ICD-10-CM

## 2013-12-15 DIAGNOSIS — R0989 Other specified symptoms and signs involving the circulatory and respiratory systems: Secondary | ICD-10-CM

## 2013-12-15 DIAGNOSIS — E7800 Pure hypercholesterolemia, unspecified: Secondary | ICD-10-CM

## 2013-12-15 DIAGNOSIS — R0609 Other forms of dyspnea: Secondary | ICD-10-CM

## 2013-12-15 DIAGNOSIS — R0600 Dyspnea, unspecified: Secondary | ICD-10-CM | POA: Insufficient documentation

## 2013-12-15 NOTE — Assessment & Plan Note (Signed)
Schedule exercise treadmill for risk stratification.

## 2013-12-15 NOTE — Progress Notes (Signed)
     HPI: 38 year old male for evaluation of risk factors. Recent laboratories in March of 2015 showed total cholesterol 174 and an LDL of 99. Patient has dyspnea with more extreme activities but not routine activities. No orthopnea, PND, pedal edema, palpitations, syncope or chest pain.  Current Outpatient Prescriptions  Medication Sig Dispense Refill  . aspirin EC 81 MG tablet Take 81 mg by mouth daily.      [name] Kitchen atorvastatin (LIPITOR) 20 MG tablet TAKE 1 TABLET BY MOUTH DAILY.  90 tablet  0  . escitalopram (LEXAPRO) 10 MG tablet Take 1 tablet (10 mg total) by mouth daily.  90 tablet  2   No current facility-administered medications for this visit.    Allergies  Allergen Reactions  . Codeine Nausea And Vomiting  . Penicillins Nausea And Vomiting  . Sulfonamide Derivatives Nausea And Vomiting    Past Medical History  Diagnosis Date  . Diverticulitis   . Pure hypercholesterolemia   . History of shingles   . History of Guillain-Barre syndrome     age 12    Past Surgical History  Procedure Laterality Date  . Deviated septum repair  1991    History   Social History  . Marital Status: Married    Spouse Name: N/A    Number of Children: 1  . Years of Education: N/A   Occupational History  .      Teacher   Social History Main Topics  . Smoking status: Never Smoker   . Smokeless tobacco: Never Used  . Alcohol Use: Yes     Comment: Occasional  . Drug Use: No  . Sexual Activity: Not on file   Other Topics Concern  . 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 Diabetes Mother   . Hypertension Mother   . Hyperlipidemia Mother   . Hypertension Father   .  Hyperlipidemia Father   . Heart attack Father     MI mid 37s  . Multiple sclerosis Sister   . Stroke Brother     history of etoh,a nd drug abuse  . Sudden death Neg Hx     ROS: no fevers or chills, productive cough, hemoptysis, dysphasia, odynophagia, melena, hematochezia, dysuria, hematuria, rash, seizure activity, orthopnea, PND, pedal edema, claudication. Remaining systems are negative.  Physical Exam:   Blood pressure 116/82, pulse 80, height 5' 8.5" (1.74 m), weight 186 lb 0.6 oz (84.387 kg).  General:  Well developed/well nourished in NAD Skin warm/dry Patient not depressed No peripheral clubbing Back-normal HEENT-normal/normal eyelids Neck supple/normal carotid upstroke bilaterally; no bruits; no JVD; no thyromegaly chest - CTA/ normal expansion CV - RRR/normal S1 and S2; no murmurs, rubs or gallops;  PMI nondisplaced Abdomen -NT/ND, no HSM, no mass, + bowel sounds, no bruit 2+ femoral pulses, no bruits Ext-no edema, chords, 2+ DP Neuro-grossly nonfocal  ECG 11/09/2013-sinus rhythm no ST changes.

## 2013-12-15 NOTE — Patient Instructions (Signed)
Your physician recommends that you schedule a follow-up appointment in: AS NEEDED PENDING TEST RESULTS  Your physician has requested that you have an exercise tolerance test. For further information please visit [email or web address]. Please also follow instruction sheet, as given.    Exercise Stress Electrocardiogram An exercise stress electrocardiogram is a test to check how blood flows to your heart. It is done to find areas of poor blood flow. You will need to walk on a treadmill for this test. The electrocardiogram will record your heartbeat when you are at rest and when you are exercising. BEFORE THE PROCEDURE  Do not have drinks with caffeine or foods with caffeine for 24 hours before the test, or as told by your doctor.  Follow your doctor's instructions about eating and drinking before the test.  Ask your doctor what medicines you should or should not take before the test. Take your medicines with water unless told by your doctor not to.  If you use an inhaler, bring it with you to the test.  Bring a snack to eat after the test.  Do not  smoke for 4 hours before the test.  Wear comfortable shoes and clothing. PROCEDURE  You will have patches put on your chest. Small areas of your chest may need to be shaved. Wires will be connected to the patches.  Your heart rate will be watched while you are resting and while you are exercising.  You will walk on the treadmill. The treadmill will slowly get faster to raise your heart rate.  The test will take about 1 2 hours. AFTER THE PROCEDURE  Your heart rate and blood pressure will be watched after the test.  You may return to your normal diet, activities, and medicines or as told by your doctor. Document Released: 01/22/2008 Document Revised: 05/26/2013 Document Reviewed: 04/12/2013 [facility] Patient Information 2014 [place].

## 2013-12-15 NOTE — Assessment & Plan Note (Signed)
Continue statin. Recent lipids are outstanding.

## 2013-12-15 NOTE — Assessment & Plan Note (Signed)
We discussed risk factor modification. He does not smoke. I asked him to be diligent with his diet and to begin an exercise program. Prior to initiating exercise we will schedule an exercise treadmill for risk stratification.

## 2014-01-12 ENCOUNTER — Telehealth: Payer: Self-pay | Admitting: *Deleted

## 2014-01-12 ENCOUNTER — Encounter: Payer: Self-pay | Admitting: Physician Assistant

## 2014-01-12 ENCOUNTER — Ambulatory Visit (INDEPENDENT_AMBULATORY_CARE_PROVIDER_SITE_OTHER): Payer: 59 | Admitting: Physician Assistant

## 2014-01-12 VITALS — BP 124/88 | HR 80 | Temp 97.8°F | Resp 16 | Ht 68.5 in | Wt 186.5 lb

## 2014-01-12 DIAGNOSIS — H109 Unspecified conjunctivitis: Secondary | ICD-10-CM

## 2014-01-12 MED ORDER — POLYMYXIN B-TRIMETHOPRIM 10000-0.1 UNIT/ML-% OP SOLN: OPHTHALMIC | Status: DC

## 2014-01-12 MED ORDER — AZITHROMYCIN 1 % OP SOLN: OPHTHALMIC | Status: DC

## 2014-01-12 NOTE — Telephone Encounter (Signed)
Received call from [place] at [facility]. Rx for azithromycin ophthalmic drops will cost $75 and pt requests cheaper alternative. ?e-mycin ointment may be cheaper per pharmacy.  Please advise. Per verbal from Provider, ok to use polymyxin B trimethoprim drops as pt does not want to use ointment. Verbal given to pharmacist.

## 2014-01-12 NOTE — Patient Instructions (Signed)
Please use antibiotic eye drop as directed.  Keep eyes lubricated with re-wetting drops.  Apply warm compress to the affected eye to promote drainage.  Keep hands washed. Call or return to clinic if symptoms are not improving.

## 2014-01-12 NOTE — Progress Notes (Signed)
Pre visit review using our clinic review tool, if applicable. No additional management support is needed unless otherwise documented below in the visit note/SLS  

## 2014-01-13 DIAGNOSIS — H109 Unspecified conjunctivitis: Secondary | ICD-10-CM | POA: Insufficient documentation

## 2014-01-13 NOTE — Assessment & Plan Note (Signed)
Rx Polytrim Op solution.  Apply as directed.  Warm compresses.  Discussed hygiene measures to prevent spread of infection.  Call or RTC if symptoms are not improving.

## 2014-01-13 NOTE — Progress Notes (Signed)
Patient presents to clinic today c/o pain, redness and pustular drainage from his left eye.  Denies fever, chills.  Denies photophobia or change in vision.  Denies sick contact.  No symptoms involving right eye.   Past Medical History  Diagnosis Date  . Diverticulitis   . Pure hypercholesterolemia   . History of shingles   . History of Guillain-Barre syndrome     age 38    Current Outpatient Prescriptions on File Prior to Visit  Medication Sig Dispense Refill  . aspirin EC 81 MG tablet Take 81 mg by mouth daily.      [name] Kitchen atorvastatin (LIPITOR) 20 MG tablet TAKE 1 TABLET BY MOUTH DAILY.  90 tablet  0  . escitalopram (LEXAPRO) 10 MG tablet Take 1 tablet (10 mg total) by mouth daily.  90 tablet  2   No current facility-administered medications on file prior to visit.    Allergies  Allergen Reactions  . Codeine Nausea And Vomiting  . Penicillins Nausea And Vomiting  . Sulfonamide Derivatives Nausea And Vomiting    Family History  Problem Relation Age of Onset  . Diabetes Mother   . Hypertension Mother   . Hyperlipidemia Mother   . Hypertension Father   . Hyperlipidemia Father   . Heart attack Father     MI mid 46s  . Multiple sclerosis Sister   . Stroke Brother     history of etoh,a nd drug abuse  . Sudden death Neg Hx     History   Social History  . Marital Status: Married    Spouse Name: N/A    Number of Children: 1  . Years of Education: N/A   Occupational History  .      Teacher   Social History Main Topics  . Smoking status: Never Smoker   . Smokeless tobacco: Never Used  . Alcohol Use: Yes     Comment: Occasional  . Drug Use: No  . Sexual Activity: None   Other Topics Concern  . 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  All other ROS are negative.  BP 124/88  Pulse 80  Temp(Src) 97.8 F (36.6 C) (Oral)  Resp 16  Ht 5' 8.5" (1.74 m)  Wt 186 lb 8 oz (84.596 kg)  BMI 27.94 kg/m2  SpO2 98%  Physical Exam  Vitals reviewed. Constitutional: He is oriented to person, place, and time and well-developed, well-nourished, and in no distress.  HENT:  Head: Normocephalic and atraumatic.  Eyes: EOM are normal. Pupils are equal, round, and reactive to light. Right eye exhibits no discharge and no exudate. Left eye exhibits discharge. No foreign body present in the left eye. Right conjunctiva is not injected. Right conjunctiva has no hemorrhage. Left conjunctiva is injected. Left conjunctiva has no hemorrhage.  Neck: Neck supple.  Lymphadenopathy:    He has no cervical adenopathy.  Neurological: He is alert and oriented to person, place, and time.  Skin: Skin is warm and dry. No rash noted.    Recent Results (from the past 2160 hour(s))  BASIC METABOLIC  PANEL WITH GFR     Status: Abnormal   Collection Time    11/09/13  8:00 AM      Result Value Ref Range   Sodium 140  135 - 145 mEq/L   Potassium 4.2  3.5 - 5.3 mEq/L   Chloride 104  96 - 112 mEq/L   CO2 27  19 - 32 mEq/L   Glucose, Bld 102 (*) 70 - 99 mg/dL   BUN 14  6 - 23 mg/dL   Creat 0.80  0.50 - 1.35 mg/dL   Calcium 9.2  8.4 - 10.5 mg/dL   GFR, Est African American >89     GFR, Est Non African American >89     Comment:       The estimated GFR is a calculation valid for adults (>=44 years old)     that uses the CKD-EPI algorithm to adjust for age and sex. It is       not to be used for children, pregnant women, hospitalized patients,        patients on dialysis, or with rapidly changing kidney function.     According to the NKDEP, eGFR >89 is normal, 60-89 shows mild     impairment, 30-59 shows moderate impairment, 15-29 shows severe     impairment and <15 is ESRD.        CBC WITH DIFFERENTIAL      Status: None   Collection Time    11/09/13  8:00 AM      Result Value Ref Range   WBC 5.7  4.0 - 10.5 K/uL   RBC 5.07  4.22 - 5.81 MIL/uL   Hemoglobin 14.4  13.0 - 17.0 g/dL   HCT 41.6  39.0 - 52.0 %   MCV 82.1  78.0 - 100.0 fL   MCH 28.4  26.0 - 34.0 pg   MCHC 34.6  30.0 - 36.0 g/dL   RDW 13.9  11.5 - 15.5 %   Platelets 197  150 - 400 K/uL   Neutrophils Relative % 66  43 - 77 %   Neutro Abs 3.8  1.7 - 7.7 K/uL   Lymphocytes Relative 22  12 - 46 %   Lymphs Abs 1.3  0.7 - 4.0 K/uL   Monocytes Relative 8  3 - 12 %   Monocytes Absolute 0.5  0.1 - 1.0 K/uL   Eosinophils Relative 3  0 - 5 %   Eosinophils Absolute 0.2  0.0 - 0.7 K/uL   Basophils Relative 1  0 - 1 %   Basophils Absolute 0.1  0.0 - 0.1 K/uL   Smear Review Criteria for review not met    LIPID PANEL     Status: None   Collection Time    11/09/13  8:00 AM      Result Value Ref Range   Cholesterol 174  0 - 200 mg/dL   Comment: ATP III Classification:           < 200        mg/dL        Desirable          200 - 239     mg/dL        Borderline High          >= 240        mg/dL        High         Triglycerides 123  <150 mg/dL   HDL 50  >  39 mg/dL   Total CHOL/HDL Ratio 3.5     VLDL 25  0 - 40 mg/dL   LDL Cholesterol 99  0 - 99 mg/dL   Comment:       Total Cholesterol/HDL Ratio:CHD Risk                            Coronary Heart Disease Risk Table                                            Men       Women              1/2 Average Risk              3.4        3.3                  Average Risk              5.0        4.4               2X Average Risk              9.6        7.1               3X Average Risk             23.4       11.0     Use the calculated Patient Ratio above and the CHD Risk table      to determine the patient's CHD Risk.     ATP III Classification (LDL):           < 100        mg/dL         Optimal          100 - 129     mg/dL         Near or Above Optimal          130 - 159     mg/dL          Borderline High          160 - 189     mg/dL         High           > 190        mg/dL         Very High        TSH     Status: None   Collection Time    11/09/13  8:00 AM      Result Value Ref Range   TSH 0.619  0.350 - 4.500 uIU/mL  HEPATIC FUNCTION PANEL     Status: None   Collection Time    11/09/13  8:00 AM      Result Value Ref Range   Total Bilirubin 0.6  0.2 - 1.2 mg/dL   Bilirubin, Direct 0.1  0.0 - 0.3 mg/dL   Indirect Bilirubin 0.5  0.2 - 1.2 mg/dL   Alkaline Phosphatase 46  39 - 117 U/L   AST 26  0 - 37 U/L   ALT 42  0 - 53 U/L   Total Protein 6.6  6.0 - 8.3 g/dL   Albumin  4.5  3.5 - 5.2 g/dL  URINALYSIS, ROUTINE W REFLEX MICROSCOPIC     Status: None   Collection Time    11/09/13  8:00 AM      Result Value Ref Range   Color, Urine YELLOW  YELLOW   APPearance CLEAR  CLEAR   Specific Gravity, Urine 1.023  1.005 - 1.030   pH 6.5  5.0 - 8.0   Glucose, UA NEG  NEG mg/dL   Bilirubin Urine NEG  NEG   Ketones, ur NEG  NEG mg/dL   Hgb urine dipstick NEG  NEG   Protein, ur NEG  NEG mg/dL   Urobilinogen, UA 0.2  0.0 - 1.0 mg/dL   Nitrite NEG  NEG   Leukocytes, UA NEG  NEG  HEMOGLOBIN A1C     Status: Abnormal   Collection Time    11/09/13  8:00 AM      Result Value Ref Range   Hemoglobin A1C 6.0 (*) <5.7 %   Comment:                                                                            According to the ADA Clinical Practice Recommendations for 2011, when     HbA1c is used as a screening test:             >=6.5%   Diagnostic of Diabetes Mellitus                (if abnormal result is confirmed)           5.7-6.4%   Increased risk of developing Diabetes Mellitus           References:Diagnosis and Classification of Diabetes Mellitus,Diabetes     VFMB,3403,70(DUKRC 1):S62-S69 and Standards of Medical Care in             Diabetes - 2011,Diabetes Care,2011,34 (Suppl 1):S11-S61.         Mean Plasma Glucose 126 (*) <117 mg/dL     Assessment/Plan: Conjunctivitis Rx Polytrim Op solution.  Apply as directed.  Warm compresses.  Discussed hygiene measures to prevent spread of infection.  Call or RTC if symptoms are not improving.

## 2014-01-25 ENCOUNTER — Telehealth: Payer: Self-pay | Admitting: Family

## 2014-01-25 ENCOUNTER — Ambulatory Visit (INDEPENDENT_AMBULATORY_CARE_PROVIDER_SITE_OTHER): Payer: 59 | Admitting: Nurse Practitioner

## 2014-01-25 DIAGNOSIS — R0609 Other forms of dyspnea: Secondary | ICD-10-CM

## 2014-01-25 DIAGNOSIS — E7800 Pure hypercholesterolemia, unspecified: Secondary | ICD-10-CM

## 2014-01-25 DIAGNOSIS — Z8249 Family history of ischemic heart disease and other diseases of the circulatory system: Secondary | ICD-10-CM

## 2014-01-25 DIAGNOSIS — R0600 Dyspnea, unspecified: Secondary | ICD-10-CM

## 2014-01-25 DIAGNOSIS — R0989 Other specified symptoms and signs involving the circulatory and respiratory systems: Secondary | ICD-10-CM

## 2014-01-25 NOTE — Progress Notes (Signed)
Exercise Treadmill Test  Pre-Exercise Testing Evaluation Rhythm: sinus tachycardia  Rate: 90 bpm     Test  Exercise Tolerance Test Ordering MD: Kirk Ruths, MD  Interpreting MD: Truitt Merle, NP  Unique Test No: 1  Treadmill:  1  Indication for ETT: exertional dyspnea  Contraindication to ETT: No   Stress Modality: exercise - treadmill  Cardiac Imaging Performed: non   Protocol: standard Bruce - maximal  Max BP:  217/94  Max MPHR (bpm):  181 85% MPR (bpm):  154  MPHR obtained (bpm):  155 % MPHR obtained:  85  Reached 85% MPHR (min:sec):  9:29 Total Exercise Time (min-sec):  10:00  Workload in METS:  11.7 Borg Scale: 15  Reason ETT Terminated:  desired heart rate attained    ST Segment Analysis At Rest: normal ST segments - no evidence of significant ST depression With Exercise: no evidence of significant ST depression  Other Information Arrhythmia:  No Angina during ETT:  absent (0) Quality of ETT:  diagnostic  ETT Interpretation:  normal - no evidence of ischemia by ST analysis  Comments: Patient presents today for routine GXT. Has positive FH for CAD, dyspnea with extreme activities and HLD.   Today the patient exercised on the standard Bruce protocol for a total of 10 minutes.  Good exercise tolerance.  Mildly hypertensive blood pressure response.  Clinically negative for chest pain. Test was stopped due to achievement of target HR.  EKG negative for ischemia. No significant arrhythmia noted.    Recommendations: CV risk factor modification.  Monitor BP at home - goal is less than 135/85   See back prn  Patient is agreeable to this plan and will call if any problems develop in the interim.   Burtis Junes, RN, [facility] [address] [place], [place]  [address] [phone]

## 2014-01-25 NOTE — Patient Instructions (Signed)
See back as needed  Call the [place] office at [phone] if you have any questions, problems or concerns.

## 2014-01-25 NOTE — Telephone Encounter (Signed)
Stress test is negative.

## 2014-01-26 NOTE — Telephone Encounter (Signed)
Left message to return my call.  

## 2014-01-28 NOTE — Telephone Encounter (Signed)
Left message for pt to return my call.

## 2014-01-28 NOTE — Telephone Encounter (Signed)
Patient called back. Informed him of what Lenna Sciara states.

## 2014-02-23 ENCOUNTER — Ambulatory Visit (INDEPENDENT_AMBULATORY_CARE_PROVIDER_SITE_OTHER): Payer: 59 | Admitting: Nurse Practitioner

## 2014-02-23 ENCOUNTER — Encounter: Payer: Self-pay | Admitting: Nurse Practitioner

## 2014-02-23 VITALS — BP 128/89 | HR 93 | Temp 98.2°F | Ht 68.5 in | Wt 185.0 lb

## 2014-02-23 DIAGNOSIS — M25512 Pain in left shoulder: Secondary | ICD-10-CM

## 2014-02-23 DIAGNOSIS — M25519 Pain in unspecified shoulder: Secondary | ICD-10-CM

## 2014-02-23 NOTE — Patient Instructions (Signed)
Apply heat-rice w/sock- several times daily. Keep arm in sling for several days, but it is important to take arm out of sling 4-5 times daily for passive range of motion exercises: allow gravity to pull arm forward, backwards, essentially in all directions. You may continue to use Ibuprophen 600 mg three times daily with food. Please see orthopedist.

## 2014-02-23 NOTE — Progress Notes (Signed)
Pre visit review using our clinic review tool, if applicable. No additional management support is needed unless otherwise documented below in the visit note. 

## 2014-02-24 NOTE — Progress Notes (Signed)
Subjective:    Isaiah Leonard is a 38 y.o. male who presents with recurrent left shoulder pain. The symptoms began yesterday. Aggravating factors: playing in pool w/daughter. 600 mg ibuprophen provided mild relief. Pain is located in the anterior glenohumeral region and deltoid. Discomfort is described as aching and burning. Symptoms are exacerbated by overhead movements and arm adduction & abduction, forward flexion.. Evaluation to date: plain films: 2013 calcified tendinosis of rotator cuff and Ortho consult: had intra-articular steroid injection w/relief. more than 6 mos ago. The following portions of the patient's history were reviewed and updated as appropriate: allergies, current medications, past medical history, past social history, past surgical history and problem list.  Review of Systems Pertinent items are noted in HPI.   Objective:    BP 128/89  Pulse 93  Temp(Src) 98.2 F (36.8 C) (Temporal)  Ht 5' 8.5" (1.74 m)  Wt 185 lb (83.915 kg)  BMI 27.72 kg/m2  SpO2 95% Right shoulder: normal active ROM, no tenderness, no impingement sign  Left shoulder: tenderness over the glenohumeral joint, distal neuromuscular exam negative, positive for tenderness about the glenohumeral joint, negative for tenderness over the acromioclavicular joint, positive for impingement sign and radial pulse intact    Procedure note: L shoulder intraarticular injection: Verbal consent obtained for intraarticular injection. Potential complications explained including pain, infection, tendon rupture. Shoulder joint accessed from posterior approach using sterile technique. Area cleansed with 3 betadine swabs. Ethy chloride spray used for anesthesia until skin blanche. Joint spaced accessed using 25 ga 1 1/2 " needle. No blood or fluid return on aspiration. 1.5 ml 1% lidocaine and 40 mg depo-medrol injected into joint space. Pt tolerated procedure with no pain. Pressure held at site for 1 minute. No bleeding or  hematoma noted. Covered site with band-aid.   Assessment:    Left calcific tendinitis    Plan:    NSAIDs per medication orders. Shoulder injection. See procedure note. Orthopedics referral. Sling, passive ROM exercises 4 times daily, heat.

## 2014-02-24 NOTE — Assessment & Plan Note (Signed)
Presents w/ recurrent L shoulder pain. Started yesterday am when woke, then became excruciating after playing w/daughter in pool. ^00 mg ibuprophen provided mild relief. Rotator symptoms. Hx of calcified tendinosis. Intra-articular injection today. Ref to ortho. Heat, Nsaids, Sling, Passive ROM exercises.

## 2014-02-28 ENCOUNTER — Other Ambulatory Visit (HOSPITAL_COMMUNITY): Payer: Self-pay | Admitting: Orthopedic Surgery

## 2014-02-28 DIAGNOSIS — M25512 Pain in left shoulder: Secondary | ICD-10-CM

## 2014-03-03 ENCOUNTER — Ambulatory Visit (HOSPITAL_COMMUNITY)
Admission: RE | Admit: 2014-03-03 | Discharge: 2014-03-03 | Disposition: A | Discharge status: Home / Self Care | Payer: 59 | Source: Ambulatory Visit | Attending: Orthopedic Surgery | Admitting: Orthopedic Surgery

## 2014-03-03 DIAGNOSIS — R609 Edema, unspecified: Secondary | ICD-10-CM | POA: Insufficient documentation

## 2014-03-03 DIAGNOSIS — M25512 Pain in left shoulder: Secondary | ICD-10-CM

## 2014-03-03 DIAGNOSIS — M25519 Pain in unspecified shoulder: Secondary | ICD-10-CM | POA: Insufficient documentation

## 2014-03-03 DIAGNOSIS — M8569 Other cyst of bone, multiple sites: Secondary | ICD-10-CM | POA: Insufficient documentation

## 2014-03-07 ENCOUNTER — Ambulatory Visit (HOSPITAL_COMMUNITY): Admission: RE | Admit: 2014-03-07 | Payer: 59 | Source: Ambulatory Visit

## 2014-04-19 ENCOUNTER — Other Ambulatory Visit: Payer: Self-pay | Admitting: Family Medicine

## 2014-05-19 HISTORY — PX: SHOULDER SURGERY: SHX246

## 2014-05-30 ENCOUNTER — Encounter (HOSPITAL_COMMUNITY): Payer: Self-pay

## 2014-06-01 ENCOUNTER — Encounter (HOSPITAL_COMMUNITY)
Admission: RE | Admit: 2014-06-01 | Discharge: 2014-06-01 | Disposition: A | Discharge status: Home / Self Care | Payer: 59 | Source: Ambulatory Visit | Attending: Orthopedic Surgery | Admitting: Orthopedic Surgery

## 2014-06-01 ENCOUNTER — Encounter (HOSPITAL_COMMUNITY): Payer: Self-pay

## 2014-06-01 DIAGNOSIS — Z0000 Encounter for general adult medical examination without abnormal findings: Secondary | ICD-10-CM | POA: Insufficient documentation

## 2014-06-01 DIAGNOSIS — M7542 Impingement syndrome of left shoulder: Secondary | ICD-10-CM | POA: Insufficient documentation

## 2014-06-01 DIAGNOSIS — M19012 Primary osteoarthritis, left shoulder: Secondary | ICD-10-CM | POA: Insufficient documentation

## 2014-06-01 DIAGNOSIS — E780 Pure hypercholesterolemia: Secondary | ICD-10-CM | POA: Diagnosis not present

## 2014-06-01 HISTORY — DX: Unspecified osteoarthritis, unspecified site: M19.90

## 2014-06-01 LAB — CBC WITH DIFFERENTIAL/PLATELET
BASOS ABS: 0 10*3/uL (ref 0.0–0.1)
BASOS PCT: 1 % (ref 0–1)
EOS ABS: 0.2 10*3/uL (ref 0.0–0.7)
Eosinophils Relative: 2 % (ref 0–5)
HCT: 41.6 % (ref 39.0–52.0)
Hemoglobin: 14.6 g/dL (ref 13.0–17.0)
Lymphocytes Relative: 29 % (ref 12–46)
Lymphs Abs: 1.9 10*3/uL (ref 0.7–4.0)
MCH: 29 pg (ref 26.0–34.0)
MCHC: 35.1 g/dL (ref 30.0–36.0)
MCV: 82.5 fL (ref 78.0–100.0)
MONOS PCT: 6 % (ref 3–12)
Monocytes Absolute: 0.4 10*3/uL (ref 0.1–1.0)
NEUTROS PCT: 62 % (ref 43–77)
Neutro Abs: 3.9 10*3/uL (ref 1.7–7.7)
PLATELETS: 195 10*3/uL (ref 150–400)
RBC: 5.04 MIL/uL (ref 4.22–5.81)
RDW: 13 % (ref 11.5–15.5)
WBC: 6.4 10*3/uL (ref 4.0–10.5)

## 2014-06-01 LAB — COMPREHENSIVE METABOLIC PANEL
ALBUMIN: 4.1 g/dL (ref 3.5–5.2)
ALT: 28 U/L (ref 0–53)
ANION GAP: 11 (ref 5–15)
AST: 22 U/L (ref 0–37)
Alkaline Phosphatase: 63 U/L (ref 39–117)
BILIRUBIN TOTAL: 0.4 mg/dL (ref 0.3–1.2)
BUN: 12 mg/dL (ref 6–23)
CO2: 26 mEq/L (ref 19–32)
Calcium: 9.5 mg/dL (ref 8.4–10.5)
Chloride: 104 mEq/L (ref 96–112)
Creatinine, Ser: 0.88 mg/dL (ref 0.50–1.35)
GFR calc Af Amer: >90 mL/min (ref >90)
GFR calc non Af Amer: >90 mL/min (ref >90)
Glucose, Bld: 88 mg/dL (ref 70–99)
Potassium: 4.3 mEq/L (ref 3.7–5.3)
SODIUM: 141 meq/L (ref 137–147)
TOTAL PROTEIN: 7.6 g/dL (ref 6.0–8.3)

## 2014-06-01 LAB — PROTIME-INR
INR: 0.99 (ref 0.00–1.49)
Prothrombin Time: 13.2 seconds (ref 11.6–15.2)

## 2014-06-01 LAB — APTT: aPTT: 30 seconds (ref 24–37)

## 2014-06-01 NOTE — Pre-Procedure Instructions (Signed)
Isaiah Leonard  06/01/2014   Your procedure is scheduled on:  Thursday June 09, 2014 at 0730 AM  Report to [facility] at 0530 AM.  Call this number if you have problems the morning of surgery: [phone]   Remember:   Do not eat food or drink liquids after midnight.   Take these medicines the morning of surgery with A SIP OF WATER: Lexapro   Do not wear jewelry.  Do not wear lotions, powders, or colognes. You may not wear deodorant.   Men may shave face and neck.  Do not bring valuables to the hospital.  [facility] is not responsible   for any belongings or valuables.               Contacts, dentures or bridgework may not be worn into surgery.  Leave suitcase in the car. After surgery it may be brought to your room.  For patients admitted to the hospital, discharge time is determined by your treatment team.               Patients discharged the day of surgery will not be allowed to drive home.    Special Instructions: [facility] - Preparing for Surgery  Before surgery, you can play an important role.  Because skin is not sterile, your skin needs to be as free of germs as possible.  You can reduce the number of germs on you skin by washing with CHG (chlorahexidine gluconate) soap before surgery.  CHG is an antiseptic cleaner which kills germs and bonds with the skin to continue killing germs even after washing.  Please DO NOT use if you have an allergy to CHG or antibacterial soaps.  If your skin becomes reddened/irritated stop using the CHG and inform your nurse when you arrive at Short Stay.  Do not shave (including legs and underarms) for at least 48 hours prior to the first CHG shower.  You may shave your face.  Please follow these instructions carefully:   1.  Shower with CHG Soap the night before surgery and the                                morning of Surgery.  2.  If you choose to wash your hair, wash your hair first as usual with your        normal shampoo.  3.  After you shampoo, rinse your hair and body thoroughly to remove the                      Shampoo.  4.  Use CHG as you would any other liquid soap.  You can apply chg directly       to the skin and wash gently with scrungie or a clean washcloth.  5.  Apply the CHG Soap to your body ONLY FROM THE NECK DOWN.        Do not use on open wounds or open sores.  Avoid contact with your eyes,       ears, mouth and genitals (private parts).  Wash genitals (private parts)       with your normal soap.  6.  Wash thoroughly, paying special attention to the area where your surgery        will be performed.  7.  Thoroughly rinse your body with warm water from the neck down.  8.  DO  NOT shower/wash with your normal soap after using and rinsing off       the CHG Soap.  9.  Pat yourself dry with a clean towel.            10.  Wear clean pajamas.            11.  Place clean sheets on your bed the night of your first shower and do not        sleep with pets.  Day of Surgery  Do not apply any lotions/deoderants the morning of surgery.  Please wear clean clothes to the hospital/surgery center.      Please read over the following fact sheets that you were given: Pain Booklet, Coughing and Deep Breathing and Surgical Site Infection Prevention

## 2014-06-08 MED ORDER — CEFAZOLIN SODIUM-DEXTROSE 2-3 GM-% IV SOLR
2.0000 g | INTRAVENOUS | Status: AC
  Administered 2014-06-09: 2 g via INTRAVENOUS
  Filled 2014-06-08: qty 50

## 2014-06-09 ENCOUNTER — Ambulatory Visit (HOSPITAL_COMMUNITY)
Admission: RE | Admit: 2014-06-09 | Discharge: 2014-06-09 | Disposition: A | Discharge status: Home / Self Care | Payer: 59 | Source: Ambulatory Visit | Attending: Orthopedic Surgery | Admitting: Orthopedic Surgery

## 2014-06-09 ENCOUNTER — Encounter (HOSPITAL_COMMUNITY): Payer: 59 | Admitting: Certified Registered"

## 2014-06-09 ENCOUNTER — Ambulatory Visit (HOSPITAL_COMMUNITY): Payer: 59 | Admitting: Certified Registered"

## 2014-06-09 ENCOUNTER — Encounter (HOSPITAL_COMMUNITY)
Admission: RE | Disposition: A | Discharge status: Home / Self Care | Payer: Self-pay | Source: Ambulatory Visit | Attending: Orthopedic Surgery

## 2014-06-09 ENCOUNTER — Encounter (HOSPITAL_COMMUNITY): Payer: Self-pay | Admitting: Certified Registered"

## 2014-06-09 ENCOUNTER — Encounter: Payer: Self-pay | Admitting: Family

## 2014-06-09 DIAGNOSIS — M19012 Primary osteoarthritis, left shoulder: Secondary | ICD-10-CM | POA: Diagnosis not present

## 2014-06-09 DIAGNOSIS — Z880 Allergy status to penicillin: Secondary | ICD-10-CM | POA: Diagnosis not present

## 2014-06-09 DIAGNOSIS — Z882 Allergy status to sulfonamides status: Secondary | ICD-10-CM | POA: Diagnosis not present

## 2014-06-09 DIAGNOSIS — Z885 Allergy status to narcotic agent status: Secondary | ICD-10-CM | POA: Diagnosis not present

## 2014-06-09 DIAGNOSIS — M7542 Impingement syndrome of left shoulder: Secondary | ICD-10-CM | POA: Insufficient documentation

## 2014-06-09 DIAGNOSIS — M7502 Adhesive capsulitis of left shoulder: Secondary | ICD-10-CM | POA: Diagnosis not present

## 2014-06-09 DIAGNOSIS — E780 Pure hypercholesterolemia: Secondary | ICD-10-CM | POA: Insufficient documentation

## 2014-06-09 DIAGNOSIS — M7532 Calcific tendinitis of left shoulder: Secondary | ICD-10-CM | POA: Insufficient documentation

## 2014-06-09 SURGERY — SHOULDER ARTHROSCOPY WITH SUBACROMIAL DECOMPRESSION AND DISTAL CLAVICLE EXCISION
Anesthesia: General | Laterality: Right

## 2014-06-09 MED ORDER — FENTANYL CITRATE 0.05 MG/ML IJ SOLN: 25.0000 ug | INTRAMUSCULAR | Status: DC | PRN

## 2014-06-09 MED ORDER — ONDANSETRON HCL 4 MG/2ML IJ SOLN
INTRAMUSCULAR | Status: AC
  Filled 2014-06-09: qty 2

## 2014-06-09 MED ORDER — LIDOCAINE HCL (CARDIAC) 20 MG/ML IV SOLN
INTRAVENOUS | Status: AC
  Filled 2014-06-09: qty 5

## 2014-06-09 MED ORDER — ARTIFICIAL TEARS OP OINT
TOPICAL_OINTMENT | OPHTHALMIC | Status: AC
  Filled 2014-06-09: qty 3.5

## 2014-06-09 MED ORDER — LIDOCAINE HCL (CARDIAC) 20 MG/ML IV SOLN
INTRAVENOUS | Status: DC | PRN
  Administered 2014-06-09: 60 mg via INTRAVENOUS

## 2014-06-09 MED ORDER — CHLORHEXIDINE GLUCONATE 4 % EX LIQD
60.0000 mL | Freq: Once | CUTANEOUS | Status: DC
Start: 2014-06-09 — End: 2014-06-09
  Filled 2014-06-09: qty 60

## 2014-06-09 MED ORDER — SODIUM CHLORIDE 0.9 % IR SOLN
Status: DC | PRN
  Administered 2014-06-09: 1000 mL

## 2014-06-09 MED ORDER — OXYCODONE-ACETAMINOPHEN 5-325 MG PO TABS: 1.0000 | ORAL_TABLET | ORAL | Status: DC | PRN

## 2014-06-09 MED ORDER — LACTATED RINGERS IV SOLN
INTRAVENOUS | Status: DC
  Administered 2014-06-09 (×2): via INTRAVENOUS

## 2014-06-09 MED ORDER — GLYCOPYRROLATE 0.2 MG/ML IJ SOLN
INTRAMUSCULAR | Status: DC | PRN
  Administered 2014-06-09: 0.4 mg via INTRAVENOUS

## 2014-06-09 MED ORDER — ROCURONIUM BROMIDE 50 MG/5ML IV SOLN
INTRAVENOUS | Status: AC
  Filled 2014-06-09: qty 1

## 2014-06-09 MED ORDER — SUCCINYLCHOLINE CHLORIDE 20 MG/ML IJ SOLN
INTRAMUSCULAR | Status: AC
  Filled 2014-06-09: qty 1

## 2014-06-09 MED ORDER — PROPOFOL 10 MG/ML IV BOLUS
INTRAVENOUS | Status: AC
  Filled 2014-06-09: qty 20

## 2014-06-09 MED ORDER — ARTIFICIAL TEARS OP OINT
TOPICAL_OINTMENT | OPHTHALMIC | Status: DC | PRN
  Administered 2014-06-09: 1 via OPHTHALMIC

## 2014-06-09 MED ORDER — PROPOFOL 10 MG/ML IV BOLUS
INTRAVENOUS | Status: DC | PRN
  Administered 2014-06-09: 200 mg via INTRAVENOUS

## 2014-06-09 MED ORDER — DIAZEPAM 5 MG PO TABS: 2.5000 mg | ORAL_TABLET | Freq: Four times a day (QID) | ORAL | Status: DC | PRN

## 2014-06-09 MED ORDER — ROCURONIUM BROMIDE 100 MG/10ML IV SOLN
INTRAVENOUS | Status: DC | PRN
  Administered 2014-06-09: 40 mg via INTRAVENOUS

## 2014-06-09 MED ORDER — FENTANYL CITRATE 0.05 MG/ML IJ SOLN
INTRAMUSCULAR | Status: AC
  Filled 2014-06-09: qty 5

## 2014-06-09 MED ORDER — ONDANSETRON HCL 4 MG/2ML IJ SOLN
INTRAMUSCULAR | Status: DC | PRN
  Administered 2014-06-09: 4 mg via INTRAVENOUS

## 2014-06-09 MED ORDER — NAPROXEN 500 MG PO TABS: 500.0000 mg | ORAL_TABLET | Freq: Two times a day (BID) | ORAL | Status: DC

## 2014-06-09 MED ORDER — NEOSTIGMINE METHYLSULFATE 10 MG/10ML IV SOLN
INTRAVENOUS | Status: DC | PRN
  Administered 2014-06-09: 3 mg via INTRAVENOUS

## 2014-06-09 MED ORDER — MIDAZOLAM HCL 2 MG/2ML IJ SOLN
INTRAMUSCULAR | Status: AC
  Filled 2014-06-09: qty 2

## 2014-06-09 MED ORDER — MIDAZOLAM HCL 5 MG/5ML IJ SOLN
INTRAMUSCULAR | Status: DC | PRN
  Administered 2014-06-09: 2 mg via INTRAVENOUS

## 2014-06-09 MED ORDER — FENTANYL CITRATE 0.05 MG/ML IJ SOLN
INTRAMUSCULAR | Status: DC | PRN
  Administered 2014-06-09: 50 ug via INTRAVENOUS

## 2014-06-09 SURGICAL SUPPLY — 66 items
APL SKNCLS STERI-STRIP NONHPOA (GAUZE/BANDAGES/DRESSINGS) ×1
BENZOIN TINCTURE PRP APPL 2/3 (GAUZE/BANDAGES/DRESSINGS) ×1 IMPLANT
BLADE CUTTER GATOR 3.5 (BLADE) ×2 IMPLANT
BLADE GREAT WHITE 4.2 (BLADE) ×2 IMPLANT
BLADE SURG 11 STRL SS (BLADE) ×2 IMPLANT
BOOTCOVER CLEANROOM LRG (PROTECTIVE WEAR) ×4 IMPLANT
BUR 3.5 LG SPHERICAL (BURR) IMPLANT
BUR OVAL 4.0 (BURR) ×2 IMPLANT
BURR 3.5 LG SPHERICAL (BURR)
CANISTER SUCT LVC 12 LTR MEDI- (MISCELLANEOUS) ×2 IMPLANT
CANNULA ACUFLEX KIT 5X76 (CANNULA) ×2 IMPLANT
CANNULA DRILOCK 5.0X75 (CANNULA) IMPLANT
CLSR STERI-STRIP ANTIMIC 1/2X4 (GAUZE/BANDAGES/DRESSINGS) ×1 IMPLANT
CONNECTOR 5 IN 1 STRAIGHT STRL (MISCELLANEOUS) ×2 IMPLANT
DRAPE INCISE 23X17 IOBAN STRL (DRAPES) ×1
DRAPE INCISE 23X17 STRL (DRAPES) ×1 IMPLANT
DRAPE INCISE IOBAN 23X17 STRL (DRAPES) ×1 IMPLANT
DRAPE INCISE IOBAN 66X45 STRL (DRAPES) ×2 IMPLANT
DRAPE ORTHO SPLIT 77X108 STRL (DRAPES) ×4
DRAPE STERI 35X30 U-POUCH (DRAPES) ×2 IMPLANT
DRAPE SURG 17X11 SM STRL (DRAPES) ×2 IMPLANT
DRAPE SURG ORHT 6 SPLT 77X108 (DRAPES) ×2 IMPLANT
DRAPE U-SHAPE 47X51 STRL (DRAPES) ×2 IMPLANT
DRSG PAD ABDOMINAL 8X10 ST (GAUZE/BANDAGES/DRESSINGS) ×4 IMPLANT
DURAPREP 26ML APPLICATOR (WOUND CARE) ×4 IMPLANT
ELECT REM PT RETURN 9FT ADLT (ELECTROSURGICAL) ×2
ELECTRODE REM PT RTRN 9FT ADLT (ELECTROSURGICAL) ×1 IMPLANT
GAUZE SPONGE 4X4 12PLY STRL (GAUZE/BANDAGES/DRESSINGS) ×2 IMPLANT
GLOVE BIO SURGEON STRL SZ7.5 (GLOVE) ×3 IMPLANT
GLOVE BIO SURGEON STRL SZ8 (GLOVE) ×2 IMPLANT
GLOVE EUDERMIC 7 POWDERFREE (GLOVE) ×3 IMPLANT
GLOVE SS BIOGEL STRL SZ 7.5 (GLOVE) ×1 IMPLANT
GLOVE SUPERSENSE BIOGEL SZ 7.5 (GLOVE) ×1
GOWN STRL REUS W/ TWL LRG LVL3 (GOWN DISPOSABLE) ×1 IMPLANT
GOWN STRL REUS W/ TWL XL LVL3 (GOWN DISPOSABLE) ×4 IMPLANT
GOWN STRL REUS W/TWL LRG LVL3 (GOWN DISPOSABLE) ×2
GOWN STRL REUS W/TWL XL LVL3 (GOWN DISPOSABLE) ×8
KIT BASIN OR (CUSTOM PROCEDURE TRAY) ×2 IMPLANT
KIT ROOM TURNOVER OR (KITS) ×2 IMPLANT
KIT SHOULDER TRACTION (DRAPES) ×2 IMPLANT
MANIFOLD NEPTUNE II (INSTRUMENTS) ×2 IMPLANT
NDL SPNL 18GX3.5 QUINCKE PK (NEEDLE) ×1 IMPLANT
NDL SUT 6 .5 CRC .975X.05 MAYO (NEEDLE) IMPLANT
NEEDLE MAYO TAPER (NEEDLE)
NEEDLE SPNL 18GX3.5 QUINCKE PK (NEEDLE) ×2 IMPLANT
NS IRRIG 1000ML POUR BTL (IV SOLUTION) ×2 IMPLANT
PACK SHOULDER (CUSTOM PROCEDURE TRAY) ×2 IMPLANT
PAD ABD 8X10 STRL (GAUZE/BANDAGES/DRESSINGS) ×1 IMPLANT
PAD ARMBOARD 7.5X6 YLW CONV (MISCELLANEOUS) ×4 IMPLANT
SET ARTHROSCOPY TUBING (MISCELLANEOUS) ×2
SET ARTHROSCOPY TUBING LN (MISCELLANEOUS) ×1 IMPLANT
SLING ARM IMMOBILIZER LRG (SOFTGOODS) ×1 IMPLANT
SLING ARM MED ADULT FOAM STRAP (SOFTGOODS) ×2 IMPLANT
SPONGE GAUZE 4X4 12PLY STER LF (GAUZE/BANDAGES/DRESSINGS) ×1 IMPLANT
SPONGE LAP 4X18 X RAY DECT (DISPOSABLE) ×2 IMPLANT
STRIP CLOSURE SKIN 1/2X4 (GAUZE/BANDAGES/DRESSINGS) ×2 IMPLANT
SUT MNCRL AB 3-0 PS2 18 (SUTURE) ×3 IMPLANT
SUT PDS AB 0 CT 36 (SUTURE) IMPLANT
SUT RETRIEVER GRASP 30 DEG (SUTURE) IMPLANT
SYR 20CC LL (SYRINGE) ×2 IMPLANT
TAPE PAPER 3X10 WHT MICROPORE (GAUZE/BANDAGES/DRESSINGS) ×2 IMPLANT
TOWEL OR 17X24 6PK STRL BLUE (TOWEL DISPOSABLE) ×2 IMPLANT
TOWEL OR 17X26 10 PK STRL BLUE (TOWEL DISPOSABLE) ×2 IMPLANT
WAND HAND CNTRL MULTIVAC 90 (MISCELLANEOUS) ×1 IMPLANT
WAND SUCTION MAX 4MM 90S (SURGICAL WAND) ×2 IMPLANT
WATER STERILE IRR 1000ML POUR (IV SOLUTION) ×2 IMPLANT

## 2014-06-09 NOTE — Op Note (Signed)
06/09/2014  9:23 AM  PATIENT:   Isaiah Leonard  38 y.o. 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  ASSISTANTS: Shuford pac   ANESTHESIA:   GET + ISB  EBL: min  SPECIMEN:  none  Drains: none   PATIENT DISPOSITION:  PACU - hemodynamically stable.    PLAN OF CARE: Discharge to home after PACU  Dictation# [id number]

## 2014-06-09 NOTE — H&P (Signed)
Isaiah Leonard    Chief Complaint: LEFT SHOULDER IMPINGEMENT AND CALCIFIC TEDONITIS HPI: The patient is a 38 y.o. 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 Diverticulitis   . Pure hypercholesterolemia   . History of shingles   . History of Guillain-Barre syndrome     age 74  . Arthritis     Past Surgical History  Procedure Laterality Date  . Deviated septum repair  1991    Family History  Problem Relation Age of Onset  . Diabetes Mother   . Hypertension Mother   . Hyperlipidemia Mother   . Hypertension Father   . Hyperlipidemia Father   . Heart attack Father     MI mid 35s  . Multiple sclerosis Sister   . Stroke Brother     history of etoh,a nd drug abuse  . Sudden death Neg Hx     Social History:  reports that he has never smoked. He has never used smokeless tobacco. He reports that he drinks alcohol. He reports that he does not use illicit drugs.  Allergies:  Allergies  Allergen Reactions  . Codeine Itching    hallucinations  . Penicillins Nausea And Vomiting    Told not to take due to Guillain Barre' Syndrome  . Sulfonamide Derivatives Nausea And Vomiting    Medications Prior to Admission  Medication Sig Dispense Refill  . aspirin EC 81 MG tablet Take 81 mg by mouth daily.      [name] Kitchen atorvastatin (LIPITOR) 20 MG tablet Take 20 mg by mouth daily.      [name] Kitchen escitalopram (LEXAPRO) 10 MG tablet Take 1 tablet (10 mg total) by mouth daily.  90 tablet  2     Physical Exam: Left shoulder with painful and restricted motion as noted at recent office visits  Vitals  Temp:  [98 F (36.7 C)] 98 F (36.7 C) (10/22 0557) Pulse Rate:  [69-82] 82 (10/22 0724) Resp:  [16] 16 (10/22 0557) BP: (121)/(81) 121/81 mmHg (10/22 0557) SpO2:  [98 %] 98 % (10/22 0557)  Assessment/Plan  Impression: LEFT SHOULDER IMPINGEMENT AND CALCIFIC TENDONITIS  Plan of Action: Procedure(s): LEFT SHOULDER  ARTHROSCOPY WITH SUBACROMIAL DECOMPRESSION AND DISTAL CLAVICLE RESECTION  Parisha Beaulac M 06/09/2014, 7:25 AM

## 2014-06-09 NOTE — Anesthesia Preprocedure Evaluation (Addendum)
Anesthesia Evaluation  Patient identified by MRN, date of birth, ID band Patient awake    Reviewed: Allergy & Precautions, H&P , NPO status   Airway Mallampati: II TM Distance: >3 FB Neck ROM: Full    Dental  (+) Teeth Intact, Dental Advisory Given   Pulmonary neg shortness of breath,  breath sounds clear to auscultation        Cardiovascular negative cardio ROS  Rhythm:Regular Rate:Normal     Neuro/Psych    GI/Hepatic Neg liver ROS,   Endo/Other    Renal/GU negative Renal ROS     Musculoskeletal   Abdominal   Peds  Hematology   Anesthesia Other Findings   Reproductive/Obstetrics                          Anesthesia Physical Anesthesia Plan  ASA: II  Anesthesia Plan: General   Post-op Pain Management:    Induction: Intravenous  Airway Management Planned: Oral ETT  Additional Equipment:   Intra-op Plan:   Post-operative Plan: Extubation in OR  Informed Consent: I have reviewed the patients History and Physical, chart, labs and discussed the procedure including the risks, benefits and alternatives for the proposed anesthesia with the patient or authorized representative who has indicated his/her understanding and acceptance.   Dental advisory given  Plan Discussed with: CRNA, Anesthesiologist and Surgeon  Anesthesia Plan Comments:        Anesthesia Quick Evaluation

## 2014-06-09 NOTE — Anesthesia Postprocedure Evaluation (Signed)
  Anesthesia Post-op Note  Patient: Isaiah Leonard  Procedure(s) Performed: Procedure(s): LEFT SHOULDER ARTHROSCOPY WITH LABRIAL DEBRIDEMENT, SUBACROMIAL DECOMPRESSION AND DISTAL CLAVICLE RESECTION (Right)  Patient Location: PACU  Anesthesia Type:General  Level of Consciousness: awake  Airway and Oxygen Therapy: Patient Spontanous Breathing  Post-op Pain: mild  Post-op Assessment: Post-op Vital signs reviewed  Post-op Vital Signs: Reviewed  Last Vitals:  Filed Vitals:   06/09/14 1030  BP: 126/78  Pulse: 67  Temp: 36.4 C  Resp:     Complications: No apparent anesthesia complications

## 2014-06-09 NOTE — Transfer of Care (Signed)
Immediate Anesthesia Transfer of Care Note  Patient: Isaiah Leonard  Procedure(s) Performed: Procedure(s): LEFT SHOULDER ARTHROSCOPY WITH LABRIAL DEBRIDEMENT, SUBACROMIAL DECOMPRESSION AND DISTAL CLAVICLE RESECTION (Right)  Patient Location: PACU  Anesthesia Type:General  Level of Consciousness: awake  Airway & Oxygen Therapy: Patient Spontanous Breathing and Patient connected to nasal cannula oxygen  Post-op Assessment: Report given to PACU RN  Post vital signs: Reviewed and stable  Complications: No apparent anesthesia complications

## 2014-06-09 NOTE — Op Note (Signed)
Isaiah Leonard, Isaiah Leonard               ACCOUNT NO.:  [id number]  MEDICAL RECORD NO.:  [id number]  LOCATION:  [facility]                         FACILITY:  [facility]  PHYSICIAN:  Metta Clines. Nils Thor, M.D.  DATE OF BIRTH:  13-Jan-1976  DATE OF PROCEDURE:  06/09/2014 DATE OF DISCHARGE:                              OPERATIVE REPORT   PREOPERATIVE DIAGNOSES: 1. Chronic left shoulder impingement syndrome. 2. Left shoulder symptomatic calcific tendinitis. 3. Left shoulder acromioclavicular joint degenerative arthrosis.  POSTOPERATIVE DIAGNOSES: 1. Chronic left shoulder impingement syndrome. 2. Left shoulder symptomatic calcific tendinitis. 3. Left shoulder acromioclavicular joint degenerative arthrosis. 4. Degenerative labral tear.  PROCEDURES: 1. Left shoulder examination under anesthesia. 2. left shoulder glenoid joint diagnostic arthroscopy. 3. Debridement of degenerative labral tear. 4. Arthroscopic subacromial depression bursectomy. 5. Arthroscopic distal clavicle resection. 6. Debridement of focal calcifications in the rotator cuff.  SURGEON:  Metta Clines. Zailen Albarran, M.D.  Terrence DupontOlivia Mackie A. Shuford, P.A.-C.  ANESTHESIA:  General endotracheal as well as an interscalene block.  ESTIMATED BLOOD LOSS:  Minimal.  DRAINS:  None.  HISTORY:  Isaiah Leonard is a 38 year old gentleman who has had chronic and progressive increasing left shoulder pain with impingement syndrome. Plain radiograph evidence for calcific tendinitis.  Clinically, he has symptomatic AC joint degenerative changes as well.  Due to his ongoing pain and functional limitations, he is brought to the operating room at this time for planned left shoulder arthroscopy as described below.  Preoperatively, I counseled Isaiah Leonard regarding treatment options and risks versus benefits thereof.  Possible surgical complication were all reviewed with the potential bleeding, infection, neurovascular injury, persistent pain, loss of motion, anesthetic  complication, and possible need for additional surgery.  He understands and accepts and agrees with our planned procedure.  PROCEDURE IN DETAIL:  After undergoing routine preop evaluation, the patient received prophylactic antibiotics and an interscalene block was established in the holding area by the Anesthesia Department.  Placed supine on the operating room table, underwent smooth induction of general endotracheal anesthesia.  Turned to the right lateral decubitus position on a beanbag and appropriately padded and protected.  Left shoulder examination under anesthesia revealed full motion.  No instability patterns were noted.  Left arm was then suspended at 70 degrees of abduction with 10 pounds of traction.  Left shoulder girdle region was sterilely prepped and draped in standard fashion.  Time-out was called.  Posterior portal was established in the glenohumeral joint and anterior portal was established under direct visualization. Articular surfaces were found to be in excellent condition.  No instability patterns were noted.  The rotator cuff was carefully inspected and found to be intact with no obvious fraying or degeneration visualized from articular side.  The biceps tendon was normal caliber with no evidence for proximal or distal instability.  Although, there was degenerative tearing of the superior half of the labrum and this was debrided with a shaver to stable margin.  At this point, final inspection, irrigation were then completed within the glenohumeral joint.  Fluid was removed.  Arm was dropped down to 30 degrees of abduction with the arthroscope introduced into the subacromial space of the posterior portal and a direct lateral portal  was established in the subacromial space.  Abundant dense bursal tissue and multiple adhesions were encountered and these were all divided and excised with combination of shaver and Stryker wand.  The wand was then used to remove  the periosteum from the undersurface of the anterior half of the acromion. Subacromial decompression was performed with a bur removing a minimal amount of bone perhaps 2-3 mm since it was a very little problematic acromial morphology.  A portal was established directly anterior to the distal clavicle and the distal clavicle resection was performed with a bur.  Care was taken to confirm visualization of the entire circumference of the distal clavicle to ensure adequate removal of bone. We then completed subacromial/subdeltoid bursectomy.  I then used a spinal needle to trephinate the distal insertion rotator cuff in the area where the calcific densities had been noted on plain radiographs and did indeed gain access to the cavity that had calcium deposits and this area was probed and manipulated and trephinated such that much calcific debris was evacuated although we did not create any significant defect in the rotator cuff tissues but simply morselized and evacuated the calcific debris with blunt probing in the area.  Once the bulk of the calcific debris had been evacuated and completed the bursectomy, hemostasis was obtained.  Fluid and instruments were removed.  The portal was closed with Monocryl and Steri-Strips.  A dry dressing taped to the left shoulder.  Left arm was placed in a sling.  The patient was awakened, extubated, and taken to recovery room in stable condition.  Isaiah Loges, PA-C was used as an Environmental consultant throughout this case essential for help with positioning of the patient, positioning of the extremity, management of arthroscopic equipment, tissue manipulation, wound closer, and intraoperative decision making.     Metta Clines. [name], M.D.     KMS/MEDQ  D:  06/09/2014  T:  06/09/2014  Job:  [id number]

## 2014-06-09 NOTE — Anesthesia Procedure Notes (Addendum)
Procedure Name: Intubation Date/Time: 06/09/2014 7:42 AM Performed by: Sampson Si E Pre-anesthesia Checklist: Patient identified, Emergency Drugs available, Suction available, Patient being monitored and Timeout performed Patient Re-evaluated:Patient Re-evaluated prior to inductionOxygen Delivery Method: Circle system utilized Preoxygenation: Pre-oxygenation with 100% oxygen Intubation Type: IV induction Ventilation: Mask ventilation without difficulty Laryngoscope Size: Mac and 3 Grade View: Grade I Tube type: Oral Tube size: 7.5 mm Number of attempts: 1 Airway Equipment and Method: Stylet Placement Confirmation: ETT inserted through vocal cords under direct vision,  positive ETCO2 and breath sounds checked- equal and bilateral Secured at: 22 cm Tube secured with: Tape Dental Injury: Teeth and Oropharynx as per pre-operative assessment     Anesthesia Regional Block:  Interscalene brachial plexus block  Pre-Anesthetic Checklist: ,, timeout performed, Correct Patient, Correct Site, Correct Laterality, Correct Procedure, Correct Position, site marked, Risks and benefits discussed,  Surgical consent,  Pre-op evaluation,  At surgeon's request and post-op pain management  Laterality: Left  Prep: chloraprep       Needles:   Needle Type: Stimulator Needle - 40          Additional Needles:  Procedures: Doppler guided and nerve stimulator Interscalene brachial plexus block Narrative:  Start time: 06/09/2014 7:10 AM End time: 06/09/2014 10:37 AM Injection made incrementally with aspirations every 5 mL.  Events: other event  Performed by: Personally  Resident/CRNA: Dr. Tamala Julian

## 2014-06-09 NOTE — Discharge Instructions (Signed)
Metta Clines. Supple, M.D., F.A.A.O.S. Orthopaedic Surgery Specializing in Arthroscopic and Reconstructive Surgery of the Shoulder and Knee [phone] [address] [place], [place] [address] - Fax [phone]   POST-OP SHOULDER ARTHROSCOPY INSTRUCTIONS  1. Call the office at [phone] to schedule your first post-op appointment 7-10 days from the date of your surgery.  2. Leave the steri-strips in place over your incisions when performing dressing changes and showering. You may remove your dressings and begin showering 72 hours from surgery. You can expect drainage that is clear to bloody in nature that occasionally will soak through your dressings. If this occurs go ahead and perform a dressing change. The drainage should lessen daily and when there is no drainage from your incisions feel free to go without a dressing.  3. Wear your sling for comfort. You may come out of your sling for ad lib activity and even decide not to use the sling at all. If you find you are more comfortable in your sling, make sure you come out of your sling at least 3-4 times a day to do the exercises that are included below.  4. Range of motion to your elbow, wrist, and hand are encouraged 3-5 times daily. Exercise to your hand and fingers helps to reduce swelling you may experience.  5. Utilize ice to the shoulder 3-4 times minimum a day and additionally if you are experiencing pain.  6. You may drive when safely off narcotics and muscle relaxants.  7. If you had a block pre-operatively to provide post-op pain relief you may want to go ahead and begin utilizing your pain meds as your arm begins to wake up. Blocks can sometimes last up to 16-18 hours. If you are still pain-free prior to going to bed you may want to strongly consider taking a pain medication to avoid being awakened in the night with the onset of pain. A muscle relaxant is also provided for you should you experience muscle spasms. It  is recommended that if you are experiencing pain that your pain medication alone is not controlling, add the muscle relaxant along with the pain medication which can give additional pain relief. The first one to two days is generally the most severe of your pain and then should gradually decrease. As your pain lessens it is recommended that you decrease your use of the pain medications to an "as needed basis" only and to always comply with the recommended dosages of the pain medications.  8. Pain medications can produce constipation along with their use. If you experience this, the use of an over the counter stool softener or laxative daily is recommended.   9. For additional questions or concerns, please do not hesitate to call the office. If after hours there is an answering service to forward your concerns to the physician on call.   POST-OP EXERCISES  The pendulum exercises should be performed while bending at the waist as far over as possible thereby letting gravity do the work for you.  Range of Motion Exercises: Pendulum (circular)  Repeat 20 times. Do 3 sessions per day.     Range of Motion Exercises: Pendulum (side-to-side)  Repeat 20 times. Do 3 sessions per day.    Range of Motion Exercises (self-stretching activities):  Slide arm up wall with palm toward you, moving closer to the wall. Hold for 5 seconds.  Repeat 10 times. Do 3 sessions per day.      What to eat:  For your  first meals, you should eat lightly; only small meals initially.  If you do not have nausea, you may eat larger meals.  Avoid spicy, greasy and heavy food.    General Anesthesia, Adult, Care After  Refer to this sheet in the next few weeks. These instructions provide you with information on caring for yourself after your procedure. Your health care provider may also give you more specific instructions. Your treatment has been planned according to current medical practices, but problems sometimes  occur. Call your health care provider if you have any problems or questions after your procedure.  WHAT TO EXPECT AFTER THE PROCEDURE  After the procedure, it is typical to experience:  Sleepiness.  Nausea and vomiting. HOME CARE INSTRUCTIONS  For the first 24 hours after general anesthesia:  Have a responsible person with you.  Do not drive a car. If you are alone, do not take public transportation.  Do not drink alcohol.  Do not take medicine that has not been prescribed by your health care provider.  Do not sign important papers or make important decisions.  You may resume a normal diet and activities as directed by your health care provider.  Change bandages (dressings) as directed.  If you have questions or problems that seem related to general anesthesia, call the hospital and ask for the anesthetist or anesthesiologist on call. SEEK MEDICAL CARE IF:  You have nausea and vomiting that continue the day after anesthesia.  You develop a rash. SEEK IMMEDIATE MEDICAL CARE IF:  You have difficulty breathing.  You have chest pain.  You have any allergic problems. Document Released: 11/11/2000 Document Revised: 04/07/2013 Document Reviewed: 02/18/2013  [facility] Patient Information 2014 [place], [place].

## 2014-06-10 MED ORDER — ESCITALOPRAM OXALATE 10 MG PO TABS: 10.0000 mg | ORAL_TABLET | Freq: Every day | ORAL | Status: DC

## 2014-06-24 NOTE — Telephone Encounter (Signed)
Received notice that pt has not read mychart message. Mailed pt letter.

## 2014-07-01 ENCOUNTER — Telehealth: Payer: Self-pay | Admitting: *Deleted

## 2014-07-01 NOTE — Telephone Encounter (Signed)
Letter mailed to pt.   RE: Non-Urgent Medical Question    From  Ronny Flurry, CMA   To  [place]  06/10/2014 9:36 AM     I have sent a 90 day supply to your pharmacy. It appears that you were due for a 6 month follow up in September. You can schedule your follow up through "mychart" or call our office to set it up. She will need to see you for follow up before further refills are given. Have a good day!      Previous Messages     ----- Message -----   From: Sudie Grumbling   Sent: 06/09/2014 6:44 AM EDT    To: Nance Pear., NP  Subject: Non-Urgent Medical Question   Could you please call in a RX for lexapro, I am out and have no refills. Could I also get a 3 month supply as well. Please call it in to [facility]

## 2014-07-18 ENCOUNTER — Other Ambulatory Visit: Payer: Self-pay | Admitting: Family

## 2014-07-18 ENCOUNTER — Telehealth: Payer: Self-pay | Admitting: Family

## 2014-07-18 MED ORDER — ATORVASTATIN CALCIUM 20 MG PO TABS: 20.0000 mg | ORAL_TABLET | Freq: Every day | ORAL | Status: DC

## 2014-07-18 NOTE — Telephone Encounter (Signed)
90 day supply of lexapro was sent to pharmacy on 06/10/14. Verified with pharmacy that pt has picked up lexapro from October but is needing lipitor. Rx sent.

## 2014-07-18 NOTE — Telephone Encounter (Signed)
Patient called in to schedule appointment, he is scheduled Monday 07/25/14 @ 5:00 Requesting medication to get him through until then on Lexapro and Lipitor

## 2014-07-25 ENCOUNTER — Ambulatory Visit: Payer: 59 | Admitting: Family

## 2014-08-02 ENCOUNTER — Encounter: Payer: Self-pay | Admitting: Family

## 2014-08-02 ENCOUNTER — Ambulatory Visit (INDEPENDENT_AMBULATORY_CARE_PROVIDER_SITE_OTHER): Payer: 59 | Admitting: Family

## 2014-08-02 VITALS — BP 127/85 | HR 74 | Temp 98.3°F | Wt 186.0 lb

## 2014-08-02 DIAGNOSIS — E7800 Pure hypercholesterolemia, unspecified: Secondary | ICD-10-CM

## 2014-08-02 DIAGNOSIS — F419 Anxiety disorder, unspecified: Secondary | ICD-10-CM

## 2014-08-02 MED ORDER — ATORVASTATIN CALCIUM 20 MG PO TABS: 20.0000 mg | ORAL_TABLET | Freq: Every day | ORAL | Status: DC

## 2014-08-02 MED ORDER — ESCITALOPRAM OXALATE 10 MG PO TABS: 10.0000 mg | ORAL_TABLET | Freq: Every day | ORAL | Status: DC

## 2014-08-02 NOTE — Progress Notes (Signed)
Subjective:    Patient ID: Isaiah Leonard, male    DOB: Dec 16, 1975, 38 y.o.   MRN: [id number]  HPI  Isaiah Leonard is a 38 yr old male who presents today for follow up.  1) Hyperlipidemia- Patient is currently maintained on the following medication for hyperlipidemia: lipitor 70m Last lipid panel as follows: Lab Results  Component Value Date   CHOL 174 11/09/2013   HDL 50 11/09/2013   LDLCALC 99 11/09/2013   LDLDIRECT 139.3 04/11/2011   TRIG 123 11/09/2013   CHOLHDL 3.5 11/09/2013  Patient denies myalgia. Patient reports good compliance with low fat/low cholesterol diet.   2) Anxiety- currently maintained on and lexapro 134m He is no longer taking valium.   Review of Systems See HPI  Past Medical History  Diagnosis Date  . Diverticulitis   . Pure hypercholesterolemia   . History of shingles   . History of Guillain-Barre syndrome     age [age]. Arthritis     History   Social History  . Marital Status: Married    Spouse Name: N/A    Number of Children: 1  . Years of Education: N/A   Occupational History  .      Teacher   Social History Main Topics  . Smoking status: Never Smoker   . Smokeless tobacco: Never Used  . Alcohol Use: Yes     Comment: Occasional  . Drug Use: No  . Sexual Activity: Not on file   Other Topics Concern  . 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 Deviated septum repair  1991    Family History  Problem Relation Age of Onset  . Diabetes Mother   . Hypertension Mother   . Hyperlipidemia Mother   . Hypertension Father   . Hyperlipidemia Father   . Heart attack Father     MI mid 4041s. Multiple sclerosis Sister   . Stroke  Brother     history of etoh,a nd drug abuse  . Sudden death Neg Hx     Allergies  Allergen Reactions  . Codeine Itching    hallucinations  . Penicillins Nausea And Vomiting    Told not to take due to Guillain Barre' Syndrome  . Sulfonamide Derivatives Nausea And Vomiting    Current Outpatient Prescriptions on File Prior to Visit  Medication Sig Dispense Refill  . aspirin EC 81 MG tablet Take 81 mg by mouth daily.    . [name] Kitchentorvastatin (LIPITOR) 20 MG tablet Take 1 tablet (20 mg total) by mouth daily. 90 tablet 0  . escitalopram (LEXAPRO) 10 MG tablet Take 1 tablet (10 mg total) by mouth daily. 90 tablet 0  . naproxen (NAPROSYN) 500 MG tablet Take 1 tablet (500 mg total) by mouth 2 (two) times daily with a meal. 60 tablet 1  . diazepam (VALIUM) 5 MG tablet Take 0.5-1 tablets (2.5-5 mg total) by mouth every 6 (six) hours as needed for muscle spasms or sedation. (Patient not taking: Reported on 08/02/2014) 40 tablet 1  .  oxyCODONE-acetaminophen (PERCOCET) 5-325 MG per tablet Take 1-2 tablets by mouth every 4 (four) hours as needed. (Patient not taking: Reported on 08/02/2014) 40 tablet 0   No current facility-administered medications on file prior to visit.    BP 127/85 mmHg  Pulse 74  Temp(Src) 98.3 F (36.8 C)  Wt 186 lb (84.369 kg)  SpO2 99%       Objective:   Physical Exam  Constitutional: He is oriented to person, place, and time. He appears well-developed and well-nourished. No distress.  HENT:  Head: Normocephalic and atraumatic.  Cardiovascular: Normal rate and regular rhythm.   No murmur heard. Pulmonary/Chest: Effort normal and breath sounds normal. No respiratory distress. He has no wheezes. He has no rales. He exhibits no tenderness.  Musculoskeletal: He exhibits no edema.  Neurological: He is alert and oriented to person, place, and time.  Psychiatric: He has a normal mood and affect. His behavior is normal. Judgment and thought content normal.            Assessment & Plan:

## 2014-08-02 NOTE — Assessment & Plan Note (Signed)
Stable on lexapro 67m.

## 2014-08-02 NOTE — Patient Instructions (Addendum)
Please continue your healthy diet and exercise. Follow up in 6 months for a complete physical.

## 2014-08-02 NOTE — Progress Notes (Signed)
Pre visit review using our clinic review tool, if applicable. No additional management support is needed unless otherwise documented below in the visit note. 

## 2014-08-02 NOTE — Assessment & Plan Note (Signed)
At goal, tolerating statin, continue lipitor.

## 2014-12-21 ENCOUNTER — Ambulatory Visit (HOSPITAL_BASED_OUTPATIENT_CLINIC_OR_DEPARTMENT_OTHER)
Admission: RE | Admit: 2014-12-21 | Discharge: 2014-12-21 | Disposition: A | Discharge status: Home / Self Care | Payer: 59 | Source: Ambulatory Visit | Attending: Physician Assistant | Admitting: Physician Assistant

## 2014-12-21 ENCOUNTER — Ambulatory Visit (INDEPENDENT_AMBULATORY_CARE_PROVIDER_SITE_OTHER): Payer: 59 | Admitting: Physician Assistant

## 2014-12-21 ENCOUNTER — Encounter: Payer: Self-pay | Admitting: Physician Assistant

## 2014-12-21 VITALS — BP 137/84 | HR 80 | Temp 98.0°F | Wt 189.0 lb

## 2014-12-21 DIAGNOSIS — M79643 Pain in unspecified hand: Secondary | ICD-10-CM | POA: Diagnosis present

## 2014-12-21 DIAGNOSIS — M151 Heberden's nodes (with arthropathy): Secondary | ICD-10-CM | POA: Insufficient documentation

## 2014-12-21 DIAGNOSIS — M1990 Unspecified osteoarthritis, unspecified site: Secondary | ICD-10-CM

## 2014-12-21 DIAGNOSIS — M19041 Primary osteoarthritis, right hand: Secondary | ICD-10-CM

## 2014-12-21 DIAGNOSIS — M20092 Other deformity of left finger(s): Secondary | ICD-10-CM | POA: Diagnosis not present

## 2014-12-21 DIAGNOSIS — M19042 Primary osteoarthritis, left hand: Principal | ICD-10-CM

## 2014-12-21 NOTE — Assessment & Plan Note (Signed)
Giving recent negative workup and lack of stiffness, suspect OA.  However there is beginning of ulnar deviation.  Will obtain x-ray of bilateral hands to further assess before repeating labs.  Supportive measures discussed.  Aleve daily. Follow-up will be based on results.

## 2014-12-21 NOTE — Patient Instructions (Signed)
Please continue over-the-counter pain relief.  I would recommend Aleve once daily. Keep the fingers moving to avoid stiffness.   Go downstairs for x-ray.  I will call you with your results. We may repeat a rheumatoid panel based on results.

## 2014-12-21 NOTE — Progress Notes (Signed)
Patient presents to clinic today c/o chronic arthritic pain of hands bilaterally, worsening over the past couple of months.  Denies joint stiffness or swelling.  As noticed the tip of left index finger has started to have a curved appearance. Endorses previous negative workup for RA.  Has never had imaging of his hands.   Past Medical History  Diagnosis Date  . Diverticulitis   . Pure hypercholesterolemia   . History of shingles   . History of Guillain-Barre syndrome     age 39  . Arthritis     Current Outpatient Prescriptions on File Prior to Visit  Medication Sig Dispense Refill  . aspirin EC 81 MG tablet Take 81 mg by mouth daily.    [name] Kitchen atorvastatin (LIPITOR) 20 MG tablet Take 1 tablet (20 mg total) by mouth daily. 90 tablet 1  . escitalopram (LEXAPRO) 10 MG tablet Take 1 tablet (10 mg total) by mouth daily. 90 tablet 1   No current facility-administered medications on file prior to visit.    Allergies  Allergen Reactions  . Codeine Itching    hallucinations  . Penicillins Nausea And Vomiting    Told not to take due to Guillain Barre' Syndrome  . Sulfonamide Derivatives Nausea And Vomiting    Family History  Problem Relation Age of Onset  . Diabetes Mother   . Hypertension Mother   . Hyperlipidemia Mother   . Hypertension Father   . Hyperlipidemia Father   . Heart attack Father     MI mid 79s  . Multiple sclerosis Sister   . Stroke Brother     history of etoh,a nd drug abuse  . Sudden death Neg Hx     History   Social History  . Marital Status: Married    Spouse Name: N/A  . Number of Children: 1  . Years of Education: N/A   Occupational History  .      Teacher   Social History Main Topics  . Smoking status: Never Smoker   . Smokeless tobacco: Never Used  . Alcohol Use: Yes     Comment: Occasional  . Drug Use: No  . Sexual Activity: Not on file   Other Topics Concern  . 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  All other ROS are negative.  BP 137/84 mmHg  Pulse 80  Temp(Src) 98 F (36.7 C)  Wt 189 lb (85.73 kg)  SpO2 96%  Physical Exam  Constitutional: He is oriented to person, place, and time and well-developed, well-nourished, and in no distress.  HENT:  Head: Normocephalic and atraumatic.  Cardiovascular: Normal rate, regular rhythm, normal heart sounds and intact distal pulses.   Pulmonary/Chest: Effort normal and breath sounds normal. No respiratory distress. He has no wheezes. He has no rales. He exhibits no tenderness.  Musculoskeletal:       Right hand: He exhibits normal range of motion and no tenderness. Normal sensation noted. Normal strength noted.       Left hand: He exhibits normal range of motion, no tenderness, no bony tenderness and  no swelling. Normal sensation noted. Normal strength noted.  Ulnar deviation noted of DIP of left II phalanx.  Neurological: He is alert and oriented to person, place, and time.  Skin: Skin is warm and dry. No rash noted.  Psychiatric: Affect normal.  Vitals reviewed.   No results found for this or any previous visit (from the past 2160 hour(s)).  Assessment/Plan: Arthritis of both hands Giving recent negative workup and lack of stiffness, suspect OA.  However there is beginning of ulnar deviation.  Will obtain x-ray of bilateral hands to further assess before repeating labs.  Supportive measures discussed.  Aleve daily. Follow-up will be based on results.

## 2014-12-21 NOTE — Progress Notes (Signed)
Pre visit review using our clinic review tool, if applicable. No additional management support is needed unless otherwise documented below in the visit note. 

## 2014-12-23 ENCOUNTER — Other Ambulatory Visit: Payer: Self-pay | Admitting: Physician Assistant

## 2014-12-23 DIAGNOSIS — M19041 Primary osteoarthritis, right hand: Secondary | ICD-10-CM

## 2014-12-23 DIAGNOSIS — M19042 Primary osteoarthritis, left hand: Principal | ICD-10-CM

## 2015-04-05 ENCOUNTER — Other Ambulatory Visit: Payer: Self-pay | Admitting: Family

## 2015-04-07 NOTE — Telephone Encounter (Signed)
90 day supply atorvastatin sent to pharmacy. Pt last seen 07/2014 with PCP and advised fasting CPE in 6 months. Pt is due now.  Please call pt to arrange appt.  Thanks!

## 2015-04-10 NOTE — Telephone Encounter (Signed)
Patient scheduled physical for 04/17/2015

## 2015-04-12 ENCOUNTER — Other Ambulatory Visit: Payer: Self-pay | Admitting: Family

## 2015-04-14 ENCOUNTER — Telehealth: Payer: Self-pay | Admitting: *Deleted

## 2015-04-14 ENCOUNTER — Encounter: Payer: Self-pay | Admitting: *Deleted

## 2015-04-14 NOTE — Telephone Encounter (Signed)
Pre-Visit Call completed with patient and chart updated.   Pre-Visit Info documented in Specialty Comments under [facility].    

## 2015-04-17 ENCOUNTER — Encounter: Payer: Self-pay | Admitting: Family

## 2015-04-17 ENCOUNTER — Ambulatory Visit (INDEPENDENT_AMBULATORY_CARE_PROVIDER_SITE_OTHER): Payer: 59 | Admitting: Family

## 2015-04-17 VITALS — BP 130/78 | HR 83 | Temp 97.8°F | Resp 16 | Ht 68.0 in | Wt 190.0 lb

## 2015-04-17 DIAGNOSIS — Z0000 Encounter for general adult medical examination without abnormal findings: Secondary | ICD-10-CM

## 2015-04-17 DIAGNOSIS — F419 Anxiety disorder, unspecified: Secondary | ICD-10-CM

## 2015-04-17 MED ORDER — ESCITALOPRAM OXALATE 10 MG PO TABS: 10.0000 mg | ORAL_TABLET | Freq: Every day | ORAL | Status: DC

## 2015-04-17 NOTE — Patient Instructions (Signed)
Please schedule lab visit at the front desk. Contact me in mychart with the dose of your lexapro that you are currently taking. We will need to see you back in 4-6 weeks after you change your lexapro dose to see how you are doing on the new dose.

## 2015-04-17 NOTE — Assessment & Plan Note (Signed)
He is not sure if he is taking two 5 mg tabs of lexapro or two 27m tabs of lexapro.  He will send me an note in mychart to let me know this evening. If he is taking 186mdaily will plan to increase to 2075m

## 2015-04-17 NOTE — Progress Notes (Signed)
Pre visit review using our clinic review tool, if applicable. No additional management support is needed unless otherwise documented below in the visit note. 

## 2015-04-17 NOTE — Progress Notes (Signed)
Subjective:    Patient ID: Isaiah Leonard, male    DOB: 04/27/1976, 39 y.o.   MRN: [id number]  HPI  [name] is a 39 yr old male who presents today for cpx.   Immunizations: tetanus up to date Diet: reports healthy diet Exercise: not regularly due to heat, plans to get back into exercise.   Anxiety-  Reports OCD has worsened. 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    Review of Systems  Constitutional: Negative for unexpected weight change.  HENT: Negative for rhinorrhea.   Respiratory: Negative for cough and shortness of breath.   Cardiovascular: Negative for chest pain and leg swelling.  Gastrointestinal: Negative for diarrhea, constipation and blood in stool.  Genitourinary: Negative for dysuria and frequency.  Musculoskeletal: Negative for myalgias.       Some arthritis pain in hands- improves with chondroitin  Skin: Negative for rash.  Neurological: Negative for headaches.  Hematological: Negative for adenopathy.  Psychiatric/Behavioral:       Denies depression   Past Medical History  Diagnosis Date  . Diverticulitis   . Pure hypercholesterolemia   . History of shingles   . History of Guillain-Barre syndrome     age 42  . Arthritis     Social History   Social History  . Marital Status: Married    Spouse Name: N/A  . Number of Children: 1  . Years of Education: N/A   Occupational History  .      Teacher   Social History Main Topics  . Smoking status: Never Smoker   . Smokeless tobacco: Never Used  . Alcohol Use: Yes     Comment: Occasional  . Drug Use: No  . Sexual Activity: Not on file   Other Topics Concern  . Not on file   Social History Narrative   Married   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 Deviated septum repair  1991  . Shoulder surgery Left 05/2014    removed bone spurs    Family History  Problem Relation Age of Onset  . Diabetes Mother   . Hypertension Mother   . Hyperlipidemia Mother   . Hypertension Father   . Hyperlipidemia Father   . Heart attack Father     MI mid 37s  . Multiple sclerosis Sister   . Stroke Brother     history of etoh,a nd drug abuse  . Sudden death Neg Hx     Allergies  Allergen Reactions  . Codeine Itching    hallucinations  . Penicillins Nausea And Vomiting    Told not to take due to Guillain Barre' Syndrome  . Sulfonamide Derivatives Nausea And Vomiting    Current Outpatient Prescriptions on File Prior to Visit  Medication Sig Dispense Refill  . aspirin EC 81 MG tablet Take 81 mg by mouth daily.    [name] Kitchen atorvastatin (LIPITOR) 20 MG tablet TAKE 1 TABLET (20 MG TOTAL) BY MOUTH DAILY. 90 tablet 0  . escitalopram (LEXAPRO) 10 MG tablet  TAKE 1 TABLET (10 MG TOTAL) BY MOUTH DAILY. 90 tablet 1   No current facility-administered medications on file prior to visit.    BP 130/78 mmHg  Pulse 83  Temp(Src) 97.8 F (36.6 C) (Oral)  Resp 16  Ht 5' 8"  (1.727 m)  Wt 190 lb (86.183 kg)  BMI 28.90 kg/m2  SpO2 96%        Objective:   Physical Exam  Physical Exam  Constitutional: He is oriented to person, place, and time. He appears well-developed and well-nourished. No distress.  HENT:  Head: Normocephalic and atraumatic.  Right Ear: Tympanic membrane and ear canal normal.  Left Ear: Tympanic membrane and ear canal normal.  Mouth/Throat: Oropharynx is clear and moist.  Eyes: Pupils are equal, round, and reactive to light. No scleral icterus.  Neck: Normal range of motion. No thyromegaly present.  Cardiovascular: Normal rate and regular rhythm.   No murmur heard. Pulmonary/Chest: Effort normal and breath  sounds normal. No respiratory distress. He has no wheezes. He has no rales. He exhibits no tenderness.  Abdominal: Soft. Bowel sounds are normal. He exhibits no distension and no mass. There is no tenderness. There is no rebound and no guarding.  Musculoskeletal: He exhibits no edema.  Lymphadenopathy:    He has no cervical adenopathy.  Neurological: He is alert and oriented to person, place, and time. He has normal reflexes. He exhibits normal muscle tone. Coordination normal.  Skin: Skin is warm and dry.  Psychiatric: He has a normal mood and affect. His behavior is normal. Judgment and thought content normal.          Assessment & Plan:         Assessment & Plan:

## 2015-04-17 NOTE — Assessment & Plan Note (Signed)
Immunizations reviewed and up to date.  Continue healthy diet, exercise.  Obtain routine labs.

## 2015-04-18 ENCOUNTER — Other Ambulatory Visit: Payer: 59

## 2015-04-18 LAB — HEPATIC FUNCTION PANEL
ALK PHOS: 56 U/L (ref 39–117)
ALT: 46 U/L (ref 0–53)
AST: 26 U/L (ref 0–37)
Albumin: 4.2 g/dL (ref 3.5–5.2)
BILIRUBIN DIRECT: 0.1 mg/dL (ref 0.0–0.3)
Total Bilirubin: 0.5 mg/dL (ref 0.2–1.2)
Total Protein: 7 g/dL (ref 6.0–8.3)

## 2015-04-18 LAB — BASIC METABOLIC PANEL
BUN: 17 mg/dL (ref 6–23)
CALCIUM: 9.2 mg/dL (ref 8.4–10.5)
CHLORIDE: 107 meq/L (ref 96–112)
CO2: 28 meq/L (ref 19–32)
CREATININE: 0.89 mg/dL (ref 0.40–1.50)
GFR: 100.16 mL/min (ref >60.00)
GLUCOSE: 110 mg/dL — AB (ref 70–99)
Potassium: 4 mEq/L (ref 3.5–5.1)
Sodium: 141 mEq/L (ref 135–145)

## 2015-04-18 LAB — CBC WITH DIFFERENTIAL/PLATELET
BASOS ABS: 0.1 10*3/uL (ref 0.0–0.1)
BASOS PCT: 0.9 % (ref 0.0–3.0)
EOS ABS: 0.2 10*3/uL (ref 0.0–0.7)
Eosinophils Relative: 2.6 % (ref 0.0–5.0)
HCT: 43.4 % (ref 39.0–52.0)
Hemoglobin: 14.6 g/dL (ref 13.0–17.0)
LYMPHS ABS: 1.7 10*3/uL (ref 0.7–4.0)
Lymphocytes Relative: 27.2 % (ref 12.0–46.0)
MCHC: 33.7 g/dL (ref 30.0–36.0)
MCV: 85.5 fl (ref 78.0–100.0)
MONO ABS: 0.4 10*3/uL (ref 0.1–1.0)
Monocytes Relative: 6.3 % (ref 3.0–12.0)
NEUTROS ABS: 3.8 10*3/uL (ref 1.4–7.7)
NEUTROS PCT: 63 % (ref 43.0–77.0)
PLATELETS: 236 10*3/uL (ref 150.0–400.0)
RBC: 5.08 Mil/uL (ref 4.22–5.81)
RDW: 13.5 % (ref 11.5–15.5)
WBC: 6.1 10*3/uL (ref 4.0–10.5)

## 2015-04-18 LAB — LIPID PANEL
Cholesterol: 189 mg/dL (ref 0–200)
HDL: 41.3 mg/dL (ref >39.00)
LDL CALC: 113 mg/dL — AB (ref 0–99)
NONHDL: 147.37
Total CHOL/HDL Ratio: 5
Triglycerides: 173 mg/dL — ABNORMAL HIGH (ref 0.0–149.0)
VLDL: 34.6 mg/dL (ref 0.0–40.0)

## 2015-04-18 LAB — URINALYSIS, ROUTINE W REFLEX MICROSCOPIC
BILIRUBIN URINE: NEGATIVE
KETONES UR: NEGATIVE
LEUKOCYTES UA: NEGATIVE
Nitrite: NEGATIVE
PH: 6 (ref 5.0–8.0)
Specific Gravity, Urine: 1.025 (ref 1.000–1.030)
Total Protein, Urine: NEGATIVE
Urine Glucose: NEGATIVE
Urobilinogen, UA: 0.2 (ref 0.0–1.0)

## 2015-04-18 LAB — TSH: TSH: 0.58 u[IU]/mL (ref 0.35–4.50)

## 2015-04-19 MED ORDER — ESCITALOPRAM OXALATE 20 MG PO TABS: 20.0000 mg | ORAL_TABLET | Freq: Every day | ORAL | Status: DC

## 2015-04-19 NOTE — Addendum Note (Signed)
Addended by: Kelle Darting A on: 04/19/2015 05:51 PM   Modules accepted: Orders

## 2015-04-20 ENCOUNTER — Encounter: Payer: Self-pay | Admitting: Family

## 2015-05-15 ENCOUNTER — Encounter: Payer: Self-pay | Admitting: Family

## 2015-05-15 ENCOUNTER — Ambulatory Visit (INDEPENDENT_AMBULATORY_CARE_PROVIDER_SITE_OTHER): Payer: 59 | Admitting: Family

## 2015-05-15 VITALS — BP 128/80 | HR 71 | Temp 97.9°F | Resp 16 | Ht 68.0 in | Wt 189.4 lb

## 2015-05-15 DIAGNOSIS — R739 Hyperglycemia, unspecified: Secondary | ICD-10-CM | POA: Diagnosis not present

## 2015-05-15 DIAGNOSIS — F419 Anxiety disorder, unspecified: Secondary | ICD-10-CM | POA: Diagnosis not present

## 2015-05-15 MED ORDER — ESCITALOPRAM OXALATE 20 MG PO TABS: 20.0000 mg | ORAL_TABLET | Freq: Every day | ORAL | Status: DC

## 2015-05-15 NOTE — Assessment & Plan Note (Addendum)
Anxiety and OCD symptoms are improved on current dose of lexapro. Continue same. Advised pt to watch his weight and let me know if he has any weight gain on the current dose.

## 2015-05-15 NOTE — Assessment & Plan Note (Signed)
We discussed diet, exercise weight loss. Plan A1C next visit.

## 2015-05-15 NOTE — Progress Notes (Signed)
Subjective:    Patient ID: Isaiah Leonard, male    DOB: 1976-06-16, 39 y.o.   MRN: [id number]  HPI  Isaiah Leonard is a 39 yr old male who presents today for follow up of his anxiety/OCD. Last visit he reported that his OCD symptoms had worsened despite 26m of lexapro.  Lexapro dose was increased to 271ma day. Reports that his symptoms have improved.  Denies side effects with the change in dose.  He is in the final hiring stage for a job and is hoping to begin in a week or so.  Wt Readings from Last 3 Encounters:  05/15/15 189 lb 6.4 oz (85.911 kg)  04/17/15 190 lb (86.183 kg)  12/21/14 189 lb (85.73 kg)   Declines flu shot.   Review of Systems   See HPI  Past Medical History  Diagnosis Date  . Diverticulitis   . Pure hypercholesterolemia   . History of shingles   . History of Guillain-Barre syndrome     age [age]. Arthritis     Social History   Social History  . Marital Status: Married    Spouse Name: N/A  . Number of Children: 1  . Years of Education: N/A   Occupational History  .      Teacher   Social History Main Topics  . Smoking status: Never Smoker   . Smokeless tobacco: Never Used  . Alcohol Use: Yes     Comment: Occasional  . Drug Use: No  . Sexual Activity: Not on file   Other Topics Concern  . 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 Deviated septum repair  1991  . Shoulder surgery Left 05/2014    removed bone spurs    Family History  Problem Relation Age of Onset  . Diabetes Mother   . Hypertension Mother   . Hyperlipidemia Mother   . Hypertension Father   . Hyperlipidemia Father   . Heart attack Father     MI mid 4045s. Multiple  sclerosis Sister   . Stroke Brother     history of etoh,a nd drug abuse  . Sudden death Neg Hx     Allergies  Allergen Reactions  . Codeine Itching    hallucinations  . Penicillins Nausea And Vomiting    Told not to take due to Guillain Barre' Syndrome  . Sulfonamide Derivatives Nausea And Vomiting    Current Outpatient Prescriptions on File Prior to Visit  Medication Sig Dispense Refill  . aspirin EC 81 MG tablet Take 81 mg by mouth daily.    . [name] Kitchentorvastatin (LIPITOR) 20 MG tablet TAKE 1 TABLET (20 MG TOTAL) BY MOUTH DAILY. 90 tablet 0  . escitalopram (LEXAPRO) 20 MG tablet Take 1 tablet (20 mg total) by mouth daily. 30 tablet 1   No current facility-administered medications on file prior to visit.    BP 128/80 mmHg  Pulse 71  Temp(Src) 97.9 F (36.6 C) (Oral)  Resp 16  Ht 5' 8"  (1.727 m)  Wt 189  lb 6.4 oz (85.911 kg)  BMI 28.80 kg/m2  SpO2 98%        Objective:   Physical Exam  Constitutional: He is oriented to person, place, and time. He appears well-developed and well-nourished.  Neurological: He is alert and oriented to person, place, and time.  Psychiatric: He has a normal mood and affect. His behavior is normal. Judgment and thought content normal.          Assessment & Plan:

## 2015-05-15 NOTE — Patient Instructions (Addendum)
Continue lexapro 46m. Continue to work on low fat/low cholesterol diet, exercise. Also,please work on avoiding concentrated sweets, and limiting white carbs (rice/bread/pasta/potatoes). Instead substitute whole grain versions with reasonable portions.

## 2015-06-05 ENCOUNTER — Telehealth: Payer: Self-pay | Admitting: Family

## 2015-06-05 NOTE — Telephone Encounter (Signed)
Form filled out as much as possible and forwarded to [facility]. JG//CMA

## 2015-06-05 NOTE — Telephone Encounter (Signed)
Pt left document to be filled out [facility] Examiniation Certificate)

## 2015-06-07 NOTE — Telephone Encounter (Signed)
Sent mychart message to pt. 

## 2015-06-07 NOTE — Telephone Encounter (Signed)
Pt's form is requiring PPD.  I do not see that we did PPD- do you?  He should schedule PPD placement please.

## 2015-06-12 ENCOUNTER — Ambulatory Visit (INDEPENDENT_AMBULATORY_CARE_PROVIDER_SITE_OTHER): Payer: 59 | Admitting: General Practice

## 2015-06-12 DIAGNOSIS — Z111 Encounter for screening for respiratory tuberculosis: Secondary | ICD-10-CM

## 2015-06-14 LAB — TB SKIN TEST: TB Skin Test: NEGATIVE

## 2015-07-25 ENCOUNTER — Other Ambulatory Visit: Payer: Self-pay | Admitting: Family

## 2015-08-13 ENCOUNTER — Encounter: Payer: Self-pay | Admitting: Family

## 2015-08-14 ENCOUNTER — Encounter: Payer: Self-pay | Admitting: Family

## 2015-08-15 MED ORDER — ESCITALOPRAM OXALATE 20 MG PO TABS: 20.0000 mg | ORAL_TABLET | Freq: Every day | ORAL | Status: DC

## 2015-08-15 NOTE — Telephone Encounter (Signed)
Lexapro Rx sent, #90 and 1RF. Pt informed via MyChart.

## 2015-11-13 ENCOUNTER — Encounter: Payer: Self-pay | Admitting: Family

## 2015-11-13 ENCOUNTER — Other Ambulatory Visit: Payer: Self-pay | Admitting: Family

## 2015-11-13 ENCOUNTER — Ambulatory Visit (INDEPENDENT_AMBULATORY_CARE_PROVIDER_SITE_OTHER): Payer: BC Managed Care – PPO | Admitting: Family

## 2015-11-13 VITALS — BP 130/96 | HR 81 | Temp 97.8°F | Resp 16 | Ht 68.0 in | Wt 189.8 lb

## 2015-11-13 DIAGNOSIS — E785 Hyperlipidemia, unspecified: Secondary | ICD-10-CM | POA: Diagnosis not present

## 2015-11-13 DIAGNOSIS — R739 Hyperglycemia, unspecified: Secondary | ICD-10-CM | POA: Diagnosis not present

## 2015-11-13 DIAGNOSIS — IMO0001 Reserved for inherently not codable concepts without codable children: Secondary | ICD-10-CM

## 2015-11-13 DIAGNOSIS — F419 Anxiety disorder, unspecified: Secondary | ICD-10-CM | POA: Diagnosis not present

## 2015-11-13 DIAGNOSIS — R030 Elevated blood-pressure reading, without diagnosis of hypertension: Secondary | ICD-10-CM | POA: Diagnosis not present

## 2015-11-13 LAB — LIPID PANEL
CHOLESTEROL: 215 mg/dL — AB (ref 0–200)
HDL: 48.2 mg/dL (ref >39.00)
LDL Cholesterol: 137 mg/dL — ABNORMAL HIGH (ref 0–99)
NONHDL: 166.33
Total CHOL/HDL Ratio: 4
Triglycerides: 145 mg/dL (ref 0.0–149.0)
VLDL: 29 mg/dL (ref 0.0–40.0)

## 2015-11-13 LAB — HEMOGLOBIN A1C: HEMOGLOBIN A1C: 6.3 % (ref 4.6–6.5)

## 2015-11-13 MED ORDER — ESCITALOPRAM OXALATE 20 MG PO TABS: 20.0000 mg | ORAL_TABLET | Freq: Every day | ORAL | Status: DC

## 2015-11-13 MED ORDER — ATORVASTATIN CALCIUM 40 MG PO TABS
40.0000 mg | ORAL_TABLET | Freq: Every day | ORAL | Status: DC
Start: 2015-11-13 — End: 2016-09-13

## 2015-11-13 MED FILL — ATORVASTATIN 20 MG TABLET: 20 | 90 days supply | Qty: 90 | Fill #1

## 2015-11-13 MED FILL — ESCITALOPRAM 20 MG TABLET: 20 | 90 days supply | Qty: 90 | Fill #0

## 2015-11-13 NOTE — Progress Notes (Signed)
Subjective:    Patient ID: Isaiah Leonard, male    DOB: June 12, 1976, 40 y.o.   MRN: [id number]  HPI  Isaiah Leonard is a 39 yr old male who presents today for follow up.  Anxiety/OCD- currently maintained on lexapro 75m.    Hyperglycemia-   Lab Results  Component Value Date   HGBA1C 6.0* 11/09/2013   Hyperlipidemia- currently maintained on lipitor 277m  Denies myalgia.   Lab Results  Component Value Date   CHOL 189 04/18/2015   HDL 41.30 04/18/2015   LDLCALC 113* 04/18/2015   LDLDIRECT 139.3 04/11/2011   TRIG 173.0* 04/18/2015   CHOLHDL 5 04/18/2015    Review of Systems See HPI  Past Medical History  Diagnosis Date  . Diverticulitis   . Pure hypercholesterolemia   . History of shingles   . History of Guillain-Barre syndrome     age [age]. Arthritis     Social History   Social History  . Marital Status: Married    Spouse Name: N/A  . Number of Children: 1  . Years of Education: N/A   Occupational History  .      Teacher   Social History Main Topics  . Smoking status: Never Smoker   . Smokeless tobacco: Never Used  . Alcohol Use: Yes     Comment: Occasional  . Drug Use: No  . Sexual Activity: Not on file   Other Topics Concern  . 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 Deviated septum repair  1991  . Shoulder surgery Left 05/2014    removed bone spurs    Family History  Problem Relation Age of Onset  . Diabetes Mother   . Hypertension Mother   . Hyperlipidemia Mother   . Hypertension Father   . Hyperlipidemia Father   . Heart attack Father     MI mid 4056s. Multiple sclerosis Sister   . Stroke Brother     history of etoh,a nd drug  abuse  . Sudden death Neg Hx     Allergies  Allergen Reactions  . Codeine Itching    hallucinations  . Penicillins Nausea And Vomiting    Told not to take due to Guillain Barre' Syndrome  . Sulfonamide Derivatives Nausea And Vomiting    Current Outpatient Prescriptions on File Prior to Visit  Medication Sig Dispense Refill  . aspirin EC 81 MG tablet Take 81 mg by mouth daily.    . [name] Kitchentorvastatin (LIPITOR) 20 MG tablet TAKE 1 TABLET BY MOUTH DAILY. 90 tablet 1   No current facility-administered medications on file prior to visit.    BP 130/96 mmHg  Pulse 81  Temp(Src) 97.8 F (36.6 C) (Oral)  Resp 16  Ht 5' 8"  (1.727 m)  Wt 189 lb 12.8 oz (86.093 kg)  BMI 28.87 kg/m2  SpO2 98%       Objective:   Physical Exam  Constitutional: He is oriented to person, place, and time. He appears well-developed and well-nourished. No distress.  HENT:  Head:  Normocephalic and atraumatic.  Cardiovascular: Normal rate and regular rhythm.   No murmur heard. Pulmonary/Chest: Effort normal and breath sounds normal. No respiratory distress. He has no wheezes. He has no rales.  Neurological: He is alert and oriented to person, place, and time.  Skin: Skin is warm and dry.  Psychiatric: He has a normal mood and affect. His behavior is normal. Thought content normal.          Assessment & Plan:  Elevated blood pressure- repeat bp 140/95. Advised pt on low sodium diet, exercise, plan follow up in 1 month for BP recheck.

## 2015-11-13 NOTE — Patient Instructions (Signed)
Please complete lab work prior to leaving.   

## 2015-11-13 NOTE — Progress Notes (Signed)
Pre visit review using our clinic review tool, if applicable. No additional management support is needed unless otherwise documented below in the visit note. 

## 2015-11-13 NOTE — Assessment & Plan Note (Signed)
Stable on lexapro, continue same.

## 2015-11-13 NOTE — Assessment & Plan Note (Signed)
Tolerating statin, continue same.  Obtain lipid panel.

## 2015-11-13 NOTE — Assessment & Plan Note (Signed)
Obtain A1C. 

## 2015-12-19 ENCOUNTER — Ambulatory Visit (INDEPENDENT_AMBULATORY_CARE_PROVIDER_SITE_OTHER): Payer: BC Managed Care – PPO | Admitting: Family

## 2015-12-19 ENCOUNTER — Encounter: Payer: Self-pay | Admitting: Family

## 2015-12-19 VITALS — BP 124/86 | HR 74 | Temp 97.6°F | Resp 16 | Ht 68.0 in | Wt 184.2 lb

## 2015-12-19 DIAGNOSIS — R7303 Prediabetes: Secondary | ICD-10-CM

## 2015-12-19 DIAGNOSIS — E785 Hyperlipidemia, unspecified: Secondary | ICD-10-CM | POA: Insufficient documentation

## 2015-12-19 DIAGNOSIS — R030 Elevated blood-pressure reading, without diagnosis of hypertension: Secondary | ICD-10-CM

## 2015-12-19 NOTE — Assessment & Plan Note (Signed)
Advised pt to increase statin from 53m to 470m

## 2015-12-19 NOTE — Progress Notes (Signed)
Subjective:    Patient ID: Isaiah Leonard, male    DOB: 1976-05-23, 40 y.o.   MRN: [id number]  HPI   Mr. Olshefski is a 40 yr old male who presents today for follow up.  1) elevated blood pressure:  Reports that he has improved his diet. Eating more fruits/veggies. Exercising regularly. Has lost 5 pounds since his last visit.   Wt Readings from Last 3 Encounters:  12/19/15 184 lb 3.2 oz (83.553 kg)  11/13/15 189 lb 12.8 oz (86.093 kg)  05/15/15 189 lb 6.4 oz (85.911 kg)    BP Readings from Last 3 Encounters:  12/19/15 124/86  11/13/15 130/96  05/15/15 128/80   2) Hyperlipidemia- Pt did not increase lipitor from 20 to 62m, but instead has been working on dietary adjustments.   Lab Results  Component Value Date   CHOL 215* 11/13/2015   HDL 48.20 11/13/2015   LDLCALC 137* 11/13/2015   LDLDIRECT 139.3 04/11/2011   TRIG 145.0 11/13/2015   CHOLHDL 4 11/13/2015   3) Hyperglycemia- has cut down on sugars.      Review of Systems  Respiratory: Negative for shortness of breath.   Cardiovascular: Negative for chest pain and leg swelling.   See HPI  Past Medical History  Diagnosis Date  . Diverticulitis   . Pure hypercholesterolemia   . History of shingles   . History of Guillain-Barre syndrome     age 40 . Arthritis      Social History   Social History  . Marital Status: Married    Spouse Name: N/A  . Number of Children: 1  . Years of Education: N/A   Occupational History  .      Teacher   Social History Main Topics  . Smoking status: Never Smoker   . Smokeless tobacco: Never Used  . Alcohol Use: Yes     Comment: Occasional  . Drug Use: No  . Sexual Activity: Not on file   Other Topics Concern  . 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 Deviated septum repair  1991  . Shoulder surgery Left 05/2014    removed bone spurs    Family History  Problem Relation Age of Onset  . Diabetes Mother   . Hypertension Mother   . Hyperlipidemia Mother   . Hypertension Father   . Hyperlipidemia Father   . Heart attack Father     MI mid 469s . Multiple sclerosis Sister   . Stroke Brother     history of etoh,a nd drug abuse  . Sudden death Neg Hx     Allergies  Allergen Reactions  . Codeine Itching    hallucinations  . Penicillins Nausea And Vomiting    Told not to take due to Guillain Barre' Syndrome  . Sulfonamide Derivatives Nausea And Vomiting    Current Outpatient Prescriptions on File Prior to Visit  Medication Sig Dispense Refill  . aspirin EC 81 MG tablet Take 81 mg by mouth daily.    .[name] Kitchenatorvastatin (LIPITOR) 40  MG tablet Take 1 tablet (40 mg total) by mouth daily. 30 tablet 5  . escitalopram (LEXAPRO) 20 MG tablet Take 1 tablet (20 mg total) by mouth daily. 90 tablet 1   No current facility-administered medications on file prior to visit.    BP 124/86 mmHg  Pulse 74  Temp(Src) 97.6 F (36.4 C) (Oral)  Resp 16  Ht 5' 8"  (1.727 m)  Wt 184 lb 3.2 oz (83.553 kg)  BMI 28.01 kg/m2  SpO2 98%       Objective:   Physical Exam  Constitutional: He is oriented to person, place, and time. He appears well-developed and well-nourished. No distress.  HENT:  Head: Normocephalic and atraumatic.  Cardiovascular: Normal rate and regular rhythm.   No murmur heard. Pulmonary/Chest: Effort normal and breath sounds normal. No respiratory distress. He has no wheezes. He has no rales.  Musculoskeletal: He exhibits no edema.  Neurological: He is alert and oriented to person, place, and time.  Skin: Skin is warm and dry.  Psychiatric: He has a normal mood and affect. His behavior is normal. Thought content normal.            Assessment & Plan:  Elevated blood pressure reading- BP now improved with diet, exercise weight loss.  Commended pt for his hard work.

## 2015-12-19 NOTE — Assessment & Plan Note (Signed)
Continue diet,exercise and weight loss efforts.

## 2015-12-19 NOTE — Patient Instructions (Signed)
Follow up in July. Continue healthy diet, exercise and weight loss.

## 2015-12-19 NOTE — Progress Notes (Signed)
Pre visit review using our clinic review tool, if applicable. No additional management support is needed unless otherwise documented below in the visit note. 

## 2015-12-25 MED FILL — ATORVASTATIN 40 MG TABLET: 40 | 30 days supply | Qty: 30 | Fill #0

## 2016-02-15 MED FILL — ESCITALOPRAM 20 MG TABLET: 20 | 90 days supply | Qty: 90 | Fill #1

## 2016-02-15 MED FILL — ATORVASTATIN 40 MG TABLET: 40 | 30 days supply | Qty: 30 | Fill #1

## 2016-02-21 ENCOUNTER — Ambulatory Visit (INDEPENDENT_AMBULATORY_CARE_PROVIDER_SITE_OTHER): Payer: BC Managed Care – PPO | Admitting: Family

## 2016-02-21 ENCOUNTER — Encounter: Payer: Self-pay | Admitting: Family

## 2016-02-21 VITALS — BP 120/78 | HR 67 | Temp 97.6°F | Resp 18 | Ht 68.0 in | Wt 170.8 lb

## 2016-02-21 DIAGNOSIS — R7303 Prediabetes: Secondary | ICD-10-CM

## 2016-02-21 DIAGNOSIS — E785 Hyperlipidemia, unspecified: Secondary | ICD-10-CM | POA: Diagnosis not present

## 2016-02-21 LAB — BASIC METABOLIC PANEL
BUN: 23 mg/dL (ref 6–23)
CALCIUM: 9.3 mg/dL (ref 8.4–10.5)
CO2: 27 mEq/L (ref 19–32)
CREATININE: 0.85 mg/dL (ref 0.40–1.50)
Chloride: 106 mEq/L (ref 96–112)
GFR: 105.18 mL/min (ref >60.00)
Glucose, Bld: 106 mg/dL — ABNORMAL HIGH (ref 70–99)
Potassium: 3.9 mEq/L (ref 3.5–5.1)
Sodium: 140 mEq/L (ref 135–145)

## 2016-02-21 LAB — HEMOGLOBIN A1C: HEMOGLOBIN A1C: 5.8 % (ref 4.6–6.5)

## 2016-02-21 NOTE — Assessment & Plan Note (Signed)
Suspect significant improvement in his lipids with weight loss and increase in statin. Obtain lipid panel. Continue statin.

## 2016-02-21 NOTE — Assessment & Plan Note (Addendum)
Obtain follow up A1C. BP is now normal with weight loss as well.

## 2016-02-21 NOTE — Progress Notes (Signed)
Pre visit review using our clinic review tool, if applicable. No additional management support is needed unless otherwise documented below in the visit note. 

## 2016-02-21 NOTE — Patient Instructions (Signed)
Please complete lab work prior to leaving. Great job with the diet and weight loss!!!

## 2016-02-21 NOTE — Progress Notes (Signed)
Subjective:    Patient ID: Isaiah Leonard, male    DOB: [birth year], 40 y.o.   MRN: [id number]  HPI  Mr.  Isaiah Leonard is a 40 yr old male who presents today for follow up.  Hyperlipidemia-  He is currently maintained on atorvastatin. Dose was increased last visit. Denies myalgia.    Lab Results  Component Value Date   CHOL 215* 11/13/2015   HDL 48.20 11/13/2015   LDLCALC 137* 11/13/2015   LDLDIRECT 139.3 04/11/2011   TRIG 145.0 11/13/2015   CHOLHDL 4 11/13/2015   Borderline diabetes- he has cut out white grains, really limiting sugars. Lab Results  Component Value Date   HGBA1C 6.3 11/13/2015   HGBA1C 6.0* 11/09/2013   HGBA1C 5.9 10/05/2012   Lab Results  Component Value Date   LDLCALC 137* 11/13/2015   CREATININE 0.89 04/18/2015   Wt Readings from Last 3 Encounters:  02/21/16 170 lb 12.8 oz (77.474 kg)  12/19/15 184 lb 3.2 oz (83.553 kg)  11/13/15 189 lb 12.8 oz (86.093 kg)   Elevated blood pressure-   BP Readings from Last 3 Encounters:  02/21/16 120/78  12/19/15 124/86  11/13/15 130/96       Review of Systems See HPI    Past Medical History  Diagnosis Date  . Diverticulitis   . Pure hypercholesterolemia   . History of shingles   . History of Guillain-Barre syndrome     age 78  . Arthritis      Social History   Social History  . Marital Status: Married    Spouse Name: N/A  . Number of Children: 1  . Years of Education: N/A   Occupational History  .      Teacher   Social History Main Topics  . Smoking status: Never Smoker   . Smokeless tobacco: Never Used  . Alcohol Use: Yes     Comment: Occasional  . Drug Use: No  . Sexual Activity: Not on file   Other Topics Concern  . 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 Deviated septum repair  1991  . Shoulder surgery Left 05/2014    removed bone spurs    Family History  Problem Relation Age of Onset  . Diabetes Mother   . Hypertension Mother   . Hyperlipidemia Mother   . Hypertension Father   . Hyperlipidemia Father   . Heart attack Father     MI mid 18s  . Multiple sclerosis Sister   . Stroke Brother     history of etoh,a nd drug abuse  . Sudden death Neg Hx     Allergies  Allergen Reactions  . Codeine Itching    hallucinations  . Penicillins Nausea And Vomiting    Told not to take due to Guillain Barre' Syndrome  . Sulfonamide Derivatives Nausea And Vomiting    Current Outpatient Prescriptions on File Prior to Visit  Medication Sig Dispense Refill  . aspirin EC 81 MG tablet Take 81 mg by mouth daily.    [name] Kitchen atorvastatin (LIPITOR)  40 MG tablet Take 1 tablet (40 mg total) by mouth daily. 30 tablet 5  . escitalopram (LEXAPRO) 20 MG tablet Take 1 tablet (20 mg total) by mouth daily. 90 tablet 1   No current facility-administered medications on file prior to visit.    BP 120/78 mmHg  Pulse 67  Temp(Src) 97.6 F (36.4 C) (Oral)  Resp 18  Ht 5' 8"  (1.727 m)  Wt 170 lb 12.8 oz (77.474 kg)  BMI 25.98 kg/m2  SpO2 98%    Objective:   Physical Exam  Constitutional: He is oriented to person, place, and time. He appears well-developed and well-nourished. No distress.  HENT:  Head: Normocephalic and atraumatic.  Cardiovascular: Normal rate and regular rhythm.   No murmur heard. Pulmonary/Chest: Effort normal and breath sounds normal. No respiratory distress. He has no wheezes. He has no rales.  Musculoskeletal: He exhibits no edema.  Neurological: He is alert and oriented to person, place, and time.  Skin: Skin is warm and dry.  Psychiatric: He has a normal mood and affect. His behavior is normal. Thought content normal.            Assessment & Plan:

## 2016-03-22 MED FILL — ATORVASTATIN 40 MG TABLET: 40 | 30 days supply | Qty: 30 | Fill #2

## 2016-05-09 MED FILL — ATORVASTATIN 40 MG TABLET: 40 | 30 days supply | Qty: 30 | Fill #3

## 2016-05-13 ENCOUNTER — Ambulatory Visit (INDEPENDENT_AMBULATORY_CARE_PROVIDER_SITE_OTHER): Payer: BC Managed Care – PPO | Admitting: Family

## 2016-05-13 ENCOUNTER — Encounter: Payer: Self-pay | Admitting: Family

## 2016-05-13 VITALS — BP 123/87 | HR 64 | Temp 97.7°F | Resp 16 | Ht 68.0 in | Wt 174.0 lb

## 2016-05-13 DIAGNOSIS — Z0000 Encounter for general adult medical examination without abnormal findings: Secondary | ICD-10-CM

## 2016-05-13 LAB — URINALYSIS, ROUTINE W REFLEX MICROSCOPIC
Bilirubin Urine: NEGATIVE
Hgb urine dipstick: NEGATIVE
Leukocytes, UA: NEGATIVE
Nitrite: NEGATIVE
RBC / HPF: NONE SEEN (ref 0–?)
Total Protein, Urine: NEGATIVE
URINE GLUCOSE: NEGATIVE
Urobilinogen, UA: 0.2 (ref 0.0–1.0)
WBC UA: NONE SEEN (ref 0–?)
pH: 5.5 (ref 5.0–8.0)

## 2016-05-13 LAB — CBC WITH DIFFERENTIAL/PLATELET
BASOS ABS: 0.1 10*3/uL (ref 0.0–0.1)
Basophils Relative: 1.1 % (ref 0.0–3.0)
EOS PCT: 2.9 % (ref 0.0–5.0)
Eosinophils Absolute: 0.2 10*3/uL (ref 0.0–0.7)
HEMATOCRIT: 41.2 % (ref 39.0–52.0)
Hemoglobin: 14.1 g/dL (ref 13.0–17.0)
LYMPHS ABS: 1.6 10*3/uL (ref 0.7–4.0)
LYMPHS PCT: 30.8 % (ref 12.0–46.0)
MCHC: 34.2 g/dL (ref 30.0–36.0)
MCV: 85.2 fl (ref 78.0–100.0)
MONOS PCT: 5.7 % (ref 3.0–12.0)
Monocytes Absolute: 0.3 10*3/uL (ref 0.1–1.0)
NEUTROS ABS: 3.2 10*3/uL (ref 1.4–7.7)
Neutrophils Relative %: 59.5 % (ref 43.0–77.0)
Platelets: 221 10*3/uL (ref 150.0–400.0)
RBC: 4.83 Mil/uL (ref 4.22–5.81)
RDW: 13.6 % (ref 11.5–15.5)
WBC: 5.3 10*3/uL (ref 4.0–10.5)

## 2016-05-13 LAB — LIPID PANEL
CHOL/HDL RATIO: 3
Cholesterol: 178 mg/dL (ref 0–200)
HDL: 54.1 mg/dL (ref >39.00)
LDL CALC: 110 mg/dL — AB (ref 0–99)
NonHDL: 124.15
TRIGLYCERIDES: 72 mg/dL (ref 0.0–149.0)
VLDL: 14.4 mg/dL (ref 0.0–40.0)

## 2016-05-13 LAB — HEPATIC FUNCTION PANEL
ALT: 18 U/L (ref 0–53)
AST: 18 U/L (ref 0–37)
Albumin: 4.2 g/dL (ref 3.5–5.2)
Alkaline Phosphatase: 53 U/L (ref 39–117)
BILIRUBIN DIRECT: 0.1 mg/dL (ref 0.0–0.3)
BILIRUBIN TOTAL: 0.5 mg/dL (ref 0.2–1.2)
Total Protein: 7 g/dL (ref 6.0–8.3)

## 2016-05-13 LAB — TSH: TSH: 0.63 u[IU]/mL (ref 0.35–4.50)

## 2016-05-13 NOTE — Progress Notes (Signed)
Subjective:    Patient ID: Isaiah Leonard, male    DOB: [birth year], 40 y.o.   MRN: [id number]  HPI  Isaiah Leonard is a 40 yr old male who presents today for follow up.  Immunizations: tetanus up to date, declines flu shot Diet: healthy,  Some exercise/walking lifting Wt Readings from Last 3 Encounters:  05/13/16 174 lb (78.9 kg)  02/21/16 170 lb 12.8 oz (77.5 kg)  12/19/15 184 lb 3.2 oz (83.6 kg)  Dental:  Up to date Vision:  Up to date   Review of Systems  Constitutional: Negative for unexpected weight change.  HENT: Negative for hearing loss and rhinorrhea.   Eyes: Negative for visual disturbance.  Respiratory: Negative for cough.   Cardiovascular: Negative for leg swelling.  Gastrointestinal: Negative for blood in stool, constipation and diarrhea.  Genitourinary: Negative for dysuria and frequency.  Musculoskeletal: Negative for arthralgias and myalgias.  Skin: Negative for rash.  Neurological: Negative for headaches.  Hematological: Negative for adenopathy.  Psychiatric/Behavioral:       Denies depression/anxiety   Past Medical History:  Diagnosis Date  . Arthritis   . Diverticulitis   . History of Guillain-Barre syndrome    age 63  . History of shingles   . Pure hypercholesterolemia      Social History   Social History  . Marital status: Married    Spouse name: N/A  . Number of children: 1  . Years of education: N/A   Occupational History  .  [employer]    Teacher   Social History Main Topics  . Smoking status: Never Smoker  . Smokeless tobacco: Never Used  . Alcohol use Yes     Comment: Occasional  . Drug use: No  . Sexual activity: Not on file   Other Topics Concern  . 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 deviated septum repair  1991  . SHOULDER SURGERY Left 05/2014   removed bone spurs    Family History  Problem Relation Age of Onset  . Diabetes Mother   . Hypertension Mother   . Hyperlipidemia Mother   . Hypertension Father   . Hyperlipidemia Father   . Heart attack Father     MI mid 29s  . Multiple sclerosis Sister   . Stroke Brother     history of etoh,a nd drug abuse  . Sudden death Neg Hx     Allergies  Allergen Reactions  . Codeine Itching    hallucinations  . Penicillins Nausea And Vomiting    Told not to take due to Guillain Barre' Syndrome  . Sulfonamide Derivatives Nausea And Vomiting    Current Outpatient Prescriptions on File Prior to Visit  Medication Sig Dispense Refill  . aspirin EC 81 MG tablet Take 81 mg by mouth daily.    [name] Kitchen atorvastatin (LIPITOR) 40 MG tablet Take 1 tablet (40 mg total) by mouth daily. 30 tablet 5  . escitalopram (LEXAPRO) 20 MG tablet Take 1 tablet (20 mg total) by mouth daily. 90 tablet 1  No current facility-administered medications on file prior to visit.     BP 123/87 (BP Location: Right Arm, Cuff Size: Normal)   Pulse 64   Temp 97.7 F (36.5 C) (Oral)   Resp 16   Ht 5' 8"  (1.727 m)   Wt 174 lb (78.9 kg)   SpO2 98% Comment: room air  BMI 26.46 kg/m       Objective:   Physical Exam  Physical Exam  Constitutional: He is oriented to person, place, and time. He appears well-developed and well-nourished. No distress.  HENT:  Head: Normocephalic and atraumatic.  Right Ear: Tympanic membrane and ear canal normal.  Left Ear: Tympanic membrane and ear canal normal.  Mouth/Throat: Oropharynx is clear and moist.  Eyes: Pupils are equal, round, and reactive to light. No scleral icterus.  Neck: Normal range of motion. No thyromegaly present.  Cardiovascular: Normal rate and regular rhythm.   No murmur heard. Pulmonary/Chest:  Effort normal and breath sounds normal. No respiratory distress. He has no wheezes. He has no rales. He exhibits no tenderness.  Abdominal: Soft. Bowel sounds are normal. He exhibits no distension and no mass. There is no tenderness. There is no rebound and no guarding.  Musculoskeletal: He exhibits no edema.  Lymphadenopathy:    He has no cervical adenopathy.  Neurological: He is alert and oriented to person, place, and time. He has normal patellar reflexes. He exhibits normal muscle tone. Coordination normal.  Skin: Skin is warm and dry.  Psychiatric: He has a normal mood and affect. His behavior is normal. Judgment and thought content normal.          Assessment & Plan:         Assessment & Plan:  EKG tracing is personally reviewed.  EKG notes NSR.  No acute changes.

## 2016-05-13 NOTE — Assessment & Plan Note (Signed)
Discussed healthy diet, exercise.  Obtain routine lab work. Declines flu shot today. Tetanus is up to date.

## 2016-05-13 NOTE — Progress Notes (Signed)
Pre visit review using our clinic review tool, if applicable. No additional management support is needed unless otherwise documented below in the visit note. 

## 2016-05-13 NOTE — Patient Instructions (Signed)
Please complete lab work prior to leaving. Continue your work on healthy diet and exercise.

## 2016-06-11 MED FILL — ESCITALOPRAM 20 MG TABLET: 20 | 90 days supply | Qty: 90 | Fill #0

## 2016-06-11 MED FILL — ATORVASTATIN 40 MG TABLET: 40 | 30 days supply | Qty: 30 | Fill #4

## 2016-08-01 MED FILL — ATORVASTATIN 40 MG TABLET: 40 | 30 days supply | Qty: 30 | Fill #5

## 2016-09-13 ENCOUNTER — Encounter: Payer: Self-pay | Admitting: Family

## 2016-09-13 MED ORDER — ATORVASTATIN CALCIUM 40 MG PO TABS: 40.0000 mg | ORAL_TABLET | Freq: Every day | ORAL | 2 refills | Status: DC

## 2016-09-13 MED FILL — ATORVASTATIN 40 MG TABLET: 40 | 30 days supply | Qty: 30 | Fill #0

## 2016-10-22 ENCOUNTER — Other Ambulatory Visit: Payer: Self-pay | Admitting: Family

## 2016-10-22 MED FILL — ESCITALOPRAM 20 MG TABLET: 20 | 30 days supply | Qty: 30 | Fill #0

## 2016-10-22 NOTE — Telephone Encounter (Signed)
Pt. Is due for follow up please call pt to schedule appointment.

## 2016-10-23 NOTE — Telephone Encounter (Signed)
Pt has been scheduled.  °

## 2016-10-30 ENCOUNTER — Encounter: Payer: Self-pay | Admitting: Family

## 2016-10-30 ENCOUNTER — Ambulatory Visit (INDEPENDENT_AMBULATORY_CARE_PROVIDER_SITE_OTHER): Payer: BC Managed Care – PPO | Admitting: Family

## 2016-10-30 VITALS — BP 128/87 | HR 81 | Temp 98.3°F | Resp 16 | Ht 68.0 in | Wt 183.6 lb

## 2016-10-30 DIAGNOSIS — R739 Hyperglycemia, unspecified: Secondary | ICD-10-CM

## 2016-10-30 DIAGNOSIS — E785 Hyperlipidemia, unspecified: Secondary | ICD-10-CM | POA: Diagnosis not present

## 2016-10-30 DIAGNOSIS — F419 Anxiety disorder, unspecified: Secondary | ICD-10-CM | POA: Diagnosis not present

## 2016-10-30 LAB — BASIC METABOLIC PANEL
BUN: 17 mg/dL (ref 6–23)
CO2: 28 mEq/L (ref 19–32)
CREATININE: 0.8 mg/dL (ref 0.40–1.50)
Calcium: 9.8 mg/dL (ref 8.4–10.5)
Chloride: 105 mEq/L (ref 96–112)
GFR: 112.43 mL/min (ref >60.00)
GLUCOSE: 98 mg/dL (ref 70–99)
POTASSIUM: 4.3 meq/L (ref 3.5–5.1)
Sodium: 140 mEq/L (ref 135–145)

## 2016-10-30 LAB — HEMOGLOBIN A1C: HEMOGLOBIN A1C: 6.1 % (ref 4.6–6.5)

## 2016-10-30 MED ORDER — ATORVASTATIN CALCIUM 40 MG PO TABS: 40.0000 mg | ORAL_TABLET | Freq: Every day | ORAL | 2 refills | Status: DC

## 2016-10-30 NOTE — Progress Notes (Signed)
Subjective:    Patient ID: Isaiah Leonard, male    DOB: 1976-07-31, 41 y.o.   MRN: [id number]  HPI  Mr. Kuechle is a 41 yr old male who presents today for follow up.  Hyperlipidemia-continues lipitor.  Denies myalgia Lab Results  Component Value Date   CHOL 178 05/13/2016   HDL 54.10 05/13/2016   LDLCALC 110 (H) 05/13/2016   LDLDIRECT 139.3 04/11/2011   TRIG 72.0 05/13/2016   CHOLHDL 3 05/13/2016   Anxiety- reports that his anxiety is well controlled no noticeable side effects. He continues lexapro 55m.   Hyperglycemia-   Lab Results  Component Value Date   HGBA1C 5.8 02/21/2016   HGBA1C 6.3 11/13/2015   HGBA1C 6.0 (H) 11/09/2013   Lab Results  Component Value Date   LDLCALC 110 (H) 05/13/2016   CREATININE 0.85 02/21/2016    Review of Systems See HPI  Past Medical History:  Diagnosis Date  . Arthritis   . Diverticulitis   . History of Guillain-Barre syndrome    age 41 . History of shingles   . Pure hypercholesterolemia      Social History   Social History  . Marital status: Married    Spouse name: N/A  . Number of children: 1  . Years of education: N/A   [place]up[place]   Social History Main Topics  . Smoking status: Never Smoker  . Smokeless tobacco: Never Used  . Alcohol use Yes     Comment: Occasional  . Drug use: No  . Sexual activity: Not on file   Other Topics Concern  . 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 deviated septum repair  1991  . SHOULDER SURGERY Left 05/2014   removed bone spurs    Family History  Problem Relation Age of Onset  . Diabetes Mother   . Hypertension  Mother   . Hyperlipidemia Mother   . Hypertension Father   . Hyperlipidemia Father   . Heart attack Father     MI mid 437s . Multiple sclerosis Sister   . Stroke Brother     history of etoh,a nd drug abuse  . Sudden death Neg Hx     Allergies  Allergen Reactions  . Codeine Itching    hallucinations  . Penicillins Nausea And Vomiting    Told not to take due to Guillain Barre' Syndrome  . Sulfonamide Derivatives Nausea And Vomiting    Current Outpatient Prescriptions on File Prior to Visit  Medication Sig Dispense Refill  . aspirin EC 81 MG tablet Take 81 mg by mouth daily.    .[name] Kitchenatorvastatin (LIPITOR) 40 MG tablet Take 1 tablet (40 mg total) by mouth daily. 30 tablet 2  . escitalopram (LEXAPRO) 20 MG tablet Take 1 tablet (20 mg total) by mouth daily. 90 tablet 1   No current facility-administered medications on file prior to visit.     BP 128/87 (BP Location: Right Arm, Patient Position: Sitting, Cuff Size: Large)  Pulse 81   Temp 98.3 F (36.8 C) (Oral)   Resp 16   Ht 5' 8"  (1.727 m)   Wt 183 lb 9.6 oz (83.3 kg)   SpO2 95%   BMI 27.92 kg/m       Objective:   Physical Exam  Constitutional: He is oriented to person, place, and time. He appears well-developed and well-nourished. No distress.  HENT:  Head: Normocephalic and atraumatic.  Cardiovascular: Normal rate and regular rhythm.   No murmur heard. Pulmonary/Chest: Effort normal and breath sounds normal. No respiratory distress. He has no wheezes. He has no rales.  Musculoskeletal: He exhibits no edema.  Neurological: He is alert and oriented to person, place, and time.  Skin: Skin is warm and dry.  Psychiatric: He has a normal mood and affect. His behavior is normal. Thought content normal.          Assessment & Plan:  Hyperlipidemia- stable, tolerating statin, continue same.  Hyperglycemia-  Lab Results  Component Value Date   HGBA1C 6.1 10/30/2016   A1C back up a bit. Pt was counseled on healthy  diet, exercise and weight loss.   Anxiety- Stable on lexapro- continue same.

## 2016-10-30 NOTE — Patient Instructions (Addendum)
Please complete lab work prior to leaving.  Continue your work on healthy diet, exercise and weight loss.

## 2016-10-30 NOTE — Progress Notes (Signed)
Pre visit review using our clinic review tool, if applicable. No additional management support is needed unless otherwise documented below in the visit note. 

## 2016-11-11 MED FILL — ATORVASTATIN 40 MG TABLET: 40 | 30 days supply | Qty: 30 | Fill #0

## 2016-12-12 ENCOUNTER — Other Ambulatory Visit: Payer: Self-pay | Admitting: Family

## 2016-12-13 MED FILL — ESCITALOPRAM 20 MG TABLET: 20 | 90 days supply | Qty: 90 | Fill #0

## 2017-01-06 MED FILL — ATORVASTATIN 40 MG TABLET: 40 | 30 days supply | Qty: 30 | Fill #1

## 2017-02-15 IMAGING — DX DG HAND LIMITED 1V
1 series · 1 of 1 positions shown · non-contrast
Comparison: None.

CLINICAL DATA: Chronic bilateral hand pain

EXAM:
HAND LIMITED 1 VIEW

[hand ballcatchers]
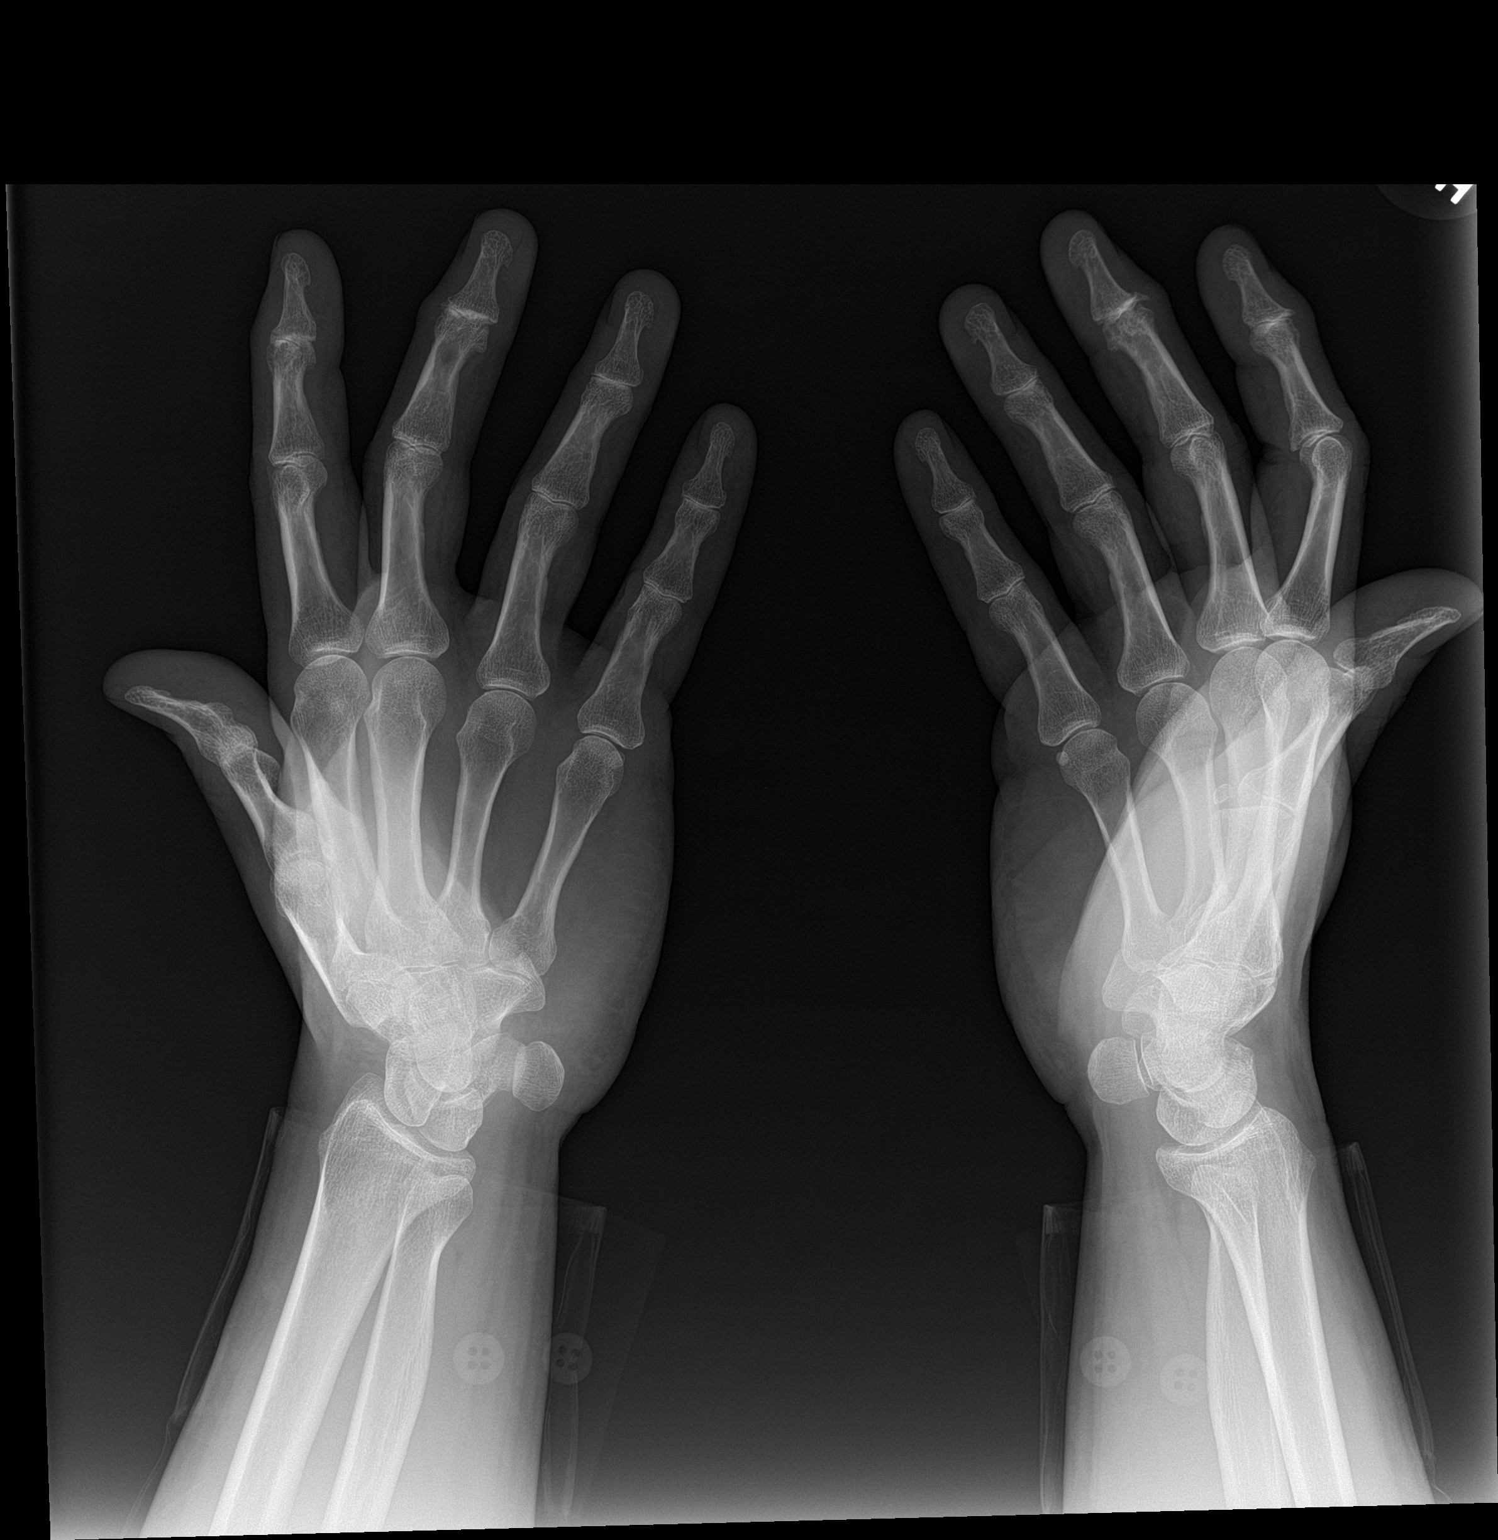

[1 of 1 positions shown; findings below may reference images not displayed]

FINDINGS: Right hand: Moderate degenerative change in the third DIP joint.
Mild to moderate degenerative change in the second DIP joint. No
fracture or erosion. Limited evaluation of the wrist joint due to
obliquity

Left hand: Mild to moderate degenerative change in the second and
third DIP joints. No erosion or fracture. Limited evaluation of the
wrist joint due to projection.
IMPRESSION: Osteoarthritis of the second and third DIP joints.  No erosions.

## 2017-02-25 MED FILL — ATORVASTATIN 40 MG TABLET: 40 | 30 days supply | Qty: 30 | Fill #2

## 2017-03-19 MED FILL — ESCITALOPRAM 20 MG TABLET: 20 | 90 days supply | Qty: 90 | Fill #1

## 2017-04-25 ENCOUNTER — Other Ambulatory Visit: Payer: Self-pay | Admitting: Family

## 2017-04-29 MED FILL — ATORVASTATIN 40 MG TABLET: 40 | 30 days supply | Qty: 30 | Fill #0 | Status: TO

## 2017-05-21 ENCOUNTER — Encounter: Payer: BC Managed Care – PPO | Admitting: Family

## 2017-06-18 MED FILL — ATORVASTATIN 40 MG TABLET: 40 | 30 days supply | Qty: 30 | Fill #1 | Status: TO

## 2017-08-05 ENCOUNTER — Other Ambulatory Visit: Payer: Self-pay | Admitting: Family Medicine

## 2017-08-05 MED FILL — ATORVASTATIN 40 MG TABLET: 40 | 30 days supply | Qty: 30 | Fill #2 | Status: TO

## 2017-08-05 NOTE — Telephone Encounter (Signed)
Received refill request for escitalopram (LEXAPRO) 20 MG tablet.  Last office visit 10/30/16 and last refill 12/12/16. Is it ok to refill? Please advise.

## 2017-08-06 MED FILL — ESCITALOPRAM 20 MG TABLET: 20 | 90 days supply | Qty: 90 | Fill #0

## 2017-08-14 ENCOUNTER — Encounter: Payer: Self-pay | Admitting: Family Medicine

## 2017-08-14 ENCOUNTER — Ambulatory Visit: Payer: BC Managed Care – PPO | Admitting: Family Medicine

## 2017-08-14 VITALS — BP 110/82 | HR 77 | Temp 98.3°F | Ht 70.0 in | Wt 185.0 lb

## 2017-08-14 DIAGNOSIS — J209 Acute bronchitis, unspecified: Secondary | ICD-10-CM

## 2017-08-14 LAB — POCT RAPID STREP A (OFFICE): RAPID STREP A SCREEN: NEGATIVE

## 2017-08-14 MED ORDER — BENZONATATE 100 MG PO CAPS: 100.0000 mg | ORAL_CAPSULE | Freq: Three times a day (TID) | ORAL | 0 refills | Status: DC | PRN

## 2017-08-14 NOTE — Progress Notes (Signed)
Chief Complaint  Patient presents with  . Sore Throat    congestion    Sudie Grumbling here for URI complaints.  Duration: 2 days   Associated symptoms: ear fullness, sore throat, chest tightness and a dry cough Denies: sinus congestion, rhinorrhea, itchy watery eyes, ear drainage, wheezing, shortness of breath, myalgia and fevers/rigors Treatment to date: None Sick contacts: Yes  ROS:  Const: Denies fevers HEENT: As noted in HPI Lungs: No SOB  Past Medical History:  Diagnosis Date  . Arthritis   . Diverticulitis   . History of Guillain-Barre syndrome    age 41  . History of shingles   . Pure hypercholesterolemia    Family History  Problem Relation Age of Onset  . Diabetes Mother   . Hypertension Mother   . Hyperlipidemia Mother   . Hypertension Father   . Hyperlipidemia Father   . Heart attack Father        MI mid 55s  . Multiple sclerosis Sister   . Stroke Brother        history of etoh,a nd drug abuse  . Sudden death Neg Hx     BP 110/82 (BP Location: Right Arm, Patient Position: Sitting, Cuff Size: Normal)   Pulse 77   Temp 98.3 F (36.8 C) (Oral)   Ht 5' 10"  (1.778 m)   Wt 185 lb (83.9 kg)   SpO2 97%   BMI 26.54 kg/m  General: Awake, alert, appears stated age HEENT: AT, [place], ears patent b/l and TM's neg, nares patent w/o discharge, pharynx pink and without exudates, MMM Neck: No masses or asymmetry Heart: RRR, no murmurs, no bruits Lungs: CTAB, no accessory muscle use Psych: Age appropriate judgment and insight, normal mood and affect  Acute bronchitis, unspecified organism - Plan: benzonatate (TESSALON) 100 MG capsule, POCT rapid strep A  Orders as above. Rapid Strep neg.  Continue to push fluids, practice good hand hygiene, cover mouth when coughing. F/u prn. If starting to experience fevers, shaking, or shortness of breath, seek immediate care. Pt voiced understanding and agreement to the plan.  [place], DO 08/14/17 9:32 AM

## 2017-08-14 NOTE — Patient Instructions (Signed)
Continue to push fluids, practice good hand hygiene, and cover your mouth if you cough.  If you start having fevers, shaking or shortness of breath, seek immediate care.  Let us know if you need anything.  

## 2017-08-14 NOTE — Progress Notes (Signed)
Pre visit review using our clinic review tool, if applicable. No additional management support is needed unless otherwise documented below in the visit note. 

## 2017-08-20 ENCOUNTER — Encounter: Payer: Self-pay | Admitting: Family

## 2017-08-20 ENCOUNTER — Ambulatory Visit (INDEPENDENT_AMBULATORY_CARE_PROVIDER_SITE_OTHER): Payer: BC Managed Care – PPO | Admitting: Family

## 2017-08-20 VITALS — BP 138/95 | HR 72 | Temp 97.9°F | Resp 16 | Ht 68.0 in | Wt 184.2 lb

## 2017-08-20 DIAGNOSIS — R030 Elevated blood-pressure reading, without diagnosis of hypertension: Secondary | ICD-10-CM | POA: Diagnosis not present

## 2017-08-20 DIAGNOSIS — R739 Hyperglycemia, unspecified: Secondary | ICD-10-CM

## 2017-08-20 DIAGNOSIS — Z0000 Encounter for general adult medical examination without abnormal findings: Secondary | ICD-10-CM

## 2017-08-20 LAB — BASIC METABOLIC PANEL
BUN: 15 mg/dL (ref 6–23)
CO2: 29 meq/L (ref 19–32)
Calcium: 9.4 mg/dL (ref 8.4–10.5)
Chloride: 103 mEq/L (ref 96–112)
Creatinine, Ser: 0.78 mg/dL (ref 0.40–1.50)
GFR: 115.32 mL/min (ref >60.00)
GLUCOSE: 103 mg/dL — AB (ref 70–99)
POTASSIUM: 4.7 meq/L (ref 3.5–5.1)
Sodium: 139 mEq/L (ref 135–145)

## 2017-08-20 LAB — LIPID PANEL
CHOLESTEROL: 189 mg/dL (ref 0–200)
HDL: 52.8 mg/dL (ref >39.00)
LDL CALC: 119 mg/dL — AB (ref 0–99)
NonHDL: 136.3
TRIGLYCERIDES: 88 mg/dL (ref 0.0–149.0)
Total CHOL/HDL Ratio: 4
VLDL: 17.6 mg/dL (ref 0.0–40.0)

## 2017-08-20 LAB — URINALYSIS, ROUTINE W REFLEX MICROSCOPIC
BILIRUBIN URINE: NEGATIVE
HGB URINE DIPSTICK: NEGATIVE
Ketones, ur: NEGATIVE
LEUKOCYTES UA: NEGATIVE
NITRITE: NEGATIVE
RBC / HPF: NONE SEEN (ref 0–?)
Specific Gravity, Urine: 1.02 (ref 1.000–1.030)
TOTAL PROTEIN, URINE-UPE24: NEGATIVE
Urine Glucose: NEGATIVE
Urobilinogen, UA: 0.2 (ref 0.0–1.0)
WBC UA: NONE SEEN (ref 0–?)
pH: 7 (ref 5.0–8.0)

## 2017-08-20 LAB — HEPATIC FUNCTION PANEL
ALBUMIN: 4.5 g/dL (ref 3.5–5.2)
ALT: 66 U/L — ABNORMAL HIGH (ref 0–53)
AST: 33 U/L (ref 0–37)
Alkaline Phosphatase: 52 U/L (ref 39–117)
BILIRUBIN TOTAL: 0.6 mg/dL (ref 0.2–1.2)
Bilirubin, Direct: 0.1 mg/dL (ref 0.0–0.3)
Total Protein: 7.3 g/dL (ref 6.0–8.3)

## 2017-08-20 LAB — CBC WITH DIFFERENTIAL/PLATELET
Basophils Absolute: 0.1 10*3/uL (ref 0.0–0.1)
Basophils Relative: 1.5 % (ref 0.0–3.0)
Eosinophils Absolute: 0.1 10*3/uL (ref 0.0–0.7)
Eosinophils Relative: 1.8 % (ref 0.0–5.0)
HCT: 43.1 % (ref 39.0–52.0)
Hemoglobin: 14.3 g/dL (ref 13.0–17.0)
LYMPHS ABS: 1.4 10*3/uL (ref 0.7–4.0)
Lymphocytes Relative: 23.3 % (ref 12.0–46.0)
MCHC: 33.3 g/dL (ref 30.0–36.0)
MCV: 86.4 fl (ref 78.0–100.0)
MONO ABS: 0.4 10*3/uL (ref 0.1–1.0)
Monocytes Relative: 6.1 % (ref 3.0–12.0)
NEUTROS ABS: 3.9 10*3/uL (ref 1.4–7.7)
NEUTROS PCT: 67.3 % (ref 43.0–77.0)
PLATELETS: 230 10*3/uL (ref 150.0–400.0)
RBC: 4.99 Mil/uL (ref 4.22–5.81)
RDW: 14.2 % (ref 11.5–15.5)
WBC: 5.8 10*3/uL (ref 4.0–10.5)

## 2017-08-20 LAB — HEMOGLOBIN A1C: HEMOGLOBIN A1C: 6.1 % (ref 4.6–6.5)

## 2017-08-20 LAB — TSH: TSH: 0.65 u[IU]/mL (ref 0.35–4.50)

## 2017-08-20 NOTE — Progress Notes (Signed)
Subjective:    Patient ID: Isaiah Leonard, male    DOB: Apr 27, 1976, 42 y.o.   MRN: [id number]  HPI  Patient presents today for complete physical.  Immunizations: tetanus up to date, declines flu shot Diet: reports that he gained some weight over the holidays Exercise: needs to be doing more, does walk the dog 2-3 times a day.  Vision: due Dental: up to date  Review of Systems  Constitutional: Negative for unexpected weight change.  HENT: Negative for hearing loss and rhinorrhea.   Eyes:       Some issues reading fine print  Respiratory: Negative for cough.   Cardiovascular: Negative for leg swelling.  Gastrointestinal: Negative for constipation and diarrhea.  Genitourinary: Negative for frequency.  Musculoskeletal: Negative for arthralgias and myalgias.  Skin: Negative for rash.  Neurological: Negative for headaches.  Hematological: Negative for adenopathy.  Psychiatric/Behavioral:       Denies depression/anxiety   Past Medical History:  Diagnosis Date  . Arthritis   . Diverticulitis   . History of Guillain-Barre syndrome    age 37  . History of shingles   . Pure hypercholesterolemia      Social History   Socioeconomic History  . Marital status: Married    Spouse name: Not on file  . Number of children: 1  . Years of education: Not on file  . Highest education level: Not on file  Social Needs  . Financial resource strain: Not on file  . Food insecurity - worry: Not on file  . Food insecurity - inability: Not on file  . Transportation needs - medical: Not on file  . Transportation needs - non-medical: Not on file  Occupational History    Employer: [employer]    Comment: Teacher  Tobacco Use  . Smoking status: Never Smoker  . Smokeless tobacco: Never Used  Substance and Sexual Activity  . Alcohol use: Yes    Comment: Occasional  . Drug use: No  . Sexual activity: Not on file  Other Topics Concern  . 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 deviated septum repair  1991  . SHOULDER SURGERY Left 05/2014   removed bone spurs    Family History  Problem Relation Age of Onset  . Diabetes Mother   . Hypertension Mother   . Hyperlipidemia Mother   . Hypertension Father   . Hyperlipidemia Father   . Heart attack Father        MI mid 88s  . Multiple sclerosis Sister   . Stroke Brother        history of etoh,a nd drug abuse  . Sudden death Neg Hx     Allergies  Allergen Reactions  . Codeine Itching    hallucinations  . Penicillins Nausea And Vomiting    Told not to take due to Guillain Barre' Syndrome  . Sulfonamide Derivatives Nausea And Vomiting    Current Outpatient Medications on File Prior to Visit  Medication Sig Dispense Refill  . aspirin EC 81 MG tablet Take 81 mg by mouth daily.    [name] Kitchen  atorvastatin (LIPITOR) 40 MG tablet TAKE 1 TABLET BY MOUTH ONCE DAILY 30 tablet 5  . escitalopram (LEXAPRO) 20 MG tablet TAKE 1 TABLET BY MOUTH ONCE DAILY 90 tablet 0   No current facility-administered medications on file prior to visit.     BP (!) 144/102 (BP Location: Right Arm, Cuff Size: Normal)   Pulse 72   Temp 97.9 F (36.6 C) (Oral)   Resp 16   Ht 5' 8"  (1.727 m)   Wt 184 lb 3.2 oz (83.6 kg)   SpO2 98%   BMI 28.01 kg/m        Objective:   Physical Exam Physical Exam  Constitutional: He is oriented to person, place, and time. He appears well-developed and well-nourished. No distress.  HENT:  Head: Normocephalic and atraumatic.  Right Ear: Tympanic membrane and ear canal normal.  Left Ear: Tympanic membrane and ear canal normal.  Mouth/Throat: Oropharynx is clear and moist.  Eyes: Pupils are equal,  round, and reactive to light. No scleral icterus.  Neck: Normal range of motion. No thyromegaly present.  Cardiovascular: Normal rate and regular rhythm.   No murmur heard. Pulmonary/Chest: Effort normal and breath sounds normal. No respiratory distress. He has no wheezes. He has no rales. He exhibits no tenderness.  Abdominal: Soft. Bowel sounds are normal. He exhibits no distension and no mass. There is no tenderness. There is no rebound and no guarding.  Musculoskeletal: He exhibits no edema.  Lymphadenopathy:    He has no cervical adenopathy.  Neurological: He is alert and oriented to person, place, and time. He has normal patellar reflexes. He exhibits normal muscle tone. Coordination normal.  Skin: Skin is warm and dry.  Psychiatric: He has a normal mood and affect. His behavior is normal. Judgment and thought content normal.           Assessment & Plan:   Preventive care-immunizations reviewed.  Tetanus is up-to-date.  Declines flu shot.  We discussed importance of increasing his cardiovascular exercise and on weight loss.  We will obtain routine lab work.  We discussed scheduling a routine optometry visit due to his issues with reading.   Elevated blood pressure reading-advised patient to follow-up in 1 month for blood pressure recheck.  This is new for him.       Assessment & Plan:  EKG tracing is personally reviewed.  EKG notes NSR.  No acute changes.

## 2017-08-20 NOTE — Patient Instructions (Addendum)
Please try to increase exercise to 30 minutes 5 days a week. Continue to work on healthy diet and weight loss.   Let me know if your anxiety does not improve after the holidays.

## 2017-08-21 ENCOUNTER — Telehealth: Payer: Self-pay | Admitting: Family

## 2017-08-21 ENCOUNTER — Encounter: Payer: Self-pay | Admitting: Family

## 2017-08-21 DIAGNOSIS — R7989 Other specified abnormal findings of blood chemistry: Secondary | ICD-10-CM

## 2017-08-21 DIAGNOSIS — R945 Abnormal results of liver function studies: Secondary | ICD-10-CM

## 2017-08-21 NOTE — Telephone Encounter (Signed)
One of his liver function tests is elevated.  I would like to repeat LFT in 1 month along with some additional lab tests (ordered). (pended) If still elevated, would send for an [place] to rule out fatty liver.  Does he drink alcohol?  How much/how often?

## 2017-08-21 NOTE — Telephone Encounter (Signed)
Advised pt of results, he was scheduled to come back 08-26-17 for repeat LFT with Ferritin and hepatitis. He reports he drinks 1-2 beers daily at dinner time.

## 2017-08-21 NOTE — Telephone Encounter (Signed)
Results given to patient in detailed, he understands and did not have any additional questions.

## 2017-08-21 NOTE — Telephone Encounter (Signed)
Pt was calling to get his lab results. He stated that someone called him but did not give him the numbers. He wanted to know if he could get a call back or put the lab results in his chart.

## 2017-09-25 ENCOUNTER — Other Ambulatory Visit: Payer: Self-pay | Admitting: Emergency Medicine

## 2017-09-25 DIAGNOSIS — R945 Abnormal results of liver function studies: Secondary | ICD-10-CM

## 2017-09-25 DIAGNOSIS — R7989 Other specified abnormal findings of blood chemistry: Secondary | ICD-10-CM

## 2017-09-26 ENCOUNTER — Other Ambulatory Visit (INDEPENDENT_AMBULATORY_CARE_PROVIDER_SITE_OTHER): Payer: BC Managed Care – PPO

## 2017-09-26 DIAGNOSIS — R945 Abnormal results of liver function studies: Secondary | ICD-10-CM

## 2017-09-26 DIAGNOSIS — R7989 Other specified abnormal findings of blood chemistry: Secondary | ICD-10-CM

## 2017-09-26 LAB — FERRITIN: Ferritin: 123.9 ng/mL (ref 22.0–322.0)

## 2017-09-26 LAB — HEPATIC FUNCTION PANEL
ALT: 17 U/L (ref 0–53)
AST: 16 U/L (ref 0–37)
Albumin: 4.4 g/dL (ref 3.5–5.2)
Alkaline Phosphatase: 40 U/L (ref 39–117)
BILIRUBIN DIRECT: 0.1 mg/dL (ref 0.0–0.3)
BILIRUBIN TOTAL: 0.5 mg/dL (ref 0.2–1.2)
TOTAL PROTEIN: 7.3 g/dL (ref 6.0–8.3)

## 2017-09-27 LAB — HEPATITIS PANEL, ACUTE
HEP A IGM: NONREACTIVE
Hep B C IgM: NONREACTIVE
Hepatitis B Surface Ag: NONREACTIVE
Hepatitis C Ab: NONREACTIVE
SIGNAL TO CUT-OFF: 0.01 (ref <1.00)

## 2017-10-24 MED FILL — ATORVASTATIN 40 MG TABLET: 40 | 30 days supply | Qty: 30 | Fill #3 | Status: TO

## 2017-12-23 ENCOUNTER — Encounter: Payer: Self-pay | Admitting: Family

## 2017-12-23 MED ORDER — ESCITALOPRAM OXALATE 20 MG PO TABS: 20.0000 mg | ORAL_TABLET | Freq: Every day | ORAL | 0 refills | Status: DC

## 2018-01-15 ENCOUNTER — Other Ambulatory Visit: Payer: Self-pay | Admitting: Family

## 2018-02-12 ENCOUNTER — Other Ambulatory Visit: Payer: Self-pay | Admitting: Family

## 2018-02-13 NOTE — Telephone Encounter (Signed)
Called pta nd informed him that a 30 day supply of Lipitor was sent in, but he would need to be seen for any further refills. Scheduled an appt for 02/18/18

## 2018-02-13 NOTE — Telephone Encounter (Signed)
30 day supply of atorvastatin sent to pharmacy. Pt last seen in 08/2017 and was advised to follow up 1 month to recheck blood pressure. Pt is past due and needs office visit with Melissa soon. Please call pt to schedule, thanks!

## 2018-02-18 ENCOUNTER — Ambulatory Visit: Payer: BC Managed Care – PPO | Admitting: Family

## 2018-02-18 ENCOUNTER — Encounter: Payer: Self-pay | Admitting: Family

## 2018-02-18 VITALS — BP 124/88 | HR 75 | Temp 97.6°F | Resp 16 | Ht 68.0 in | Wt 182.0 lb

## 2018-02-18 DIAGNOSIS — R739 Hyperglycemia, unspecified: Secondary | ICD-10-CM | POA: Diagnosis not present

## 2018-02-18 DIAGNOSIS — F419 Anxiety disorder, unspecified: Secondary | ICD-10-CM

## 2018-02-18 DIAGNOSIS — E785 Hyperlipidemia, unspecified: Secondary | ICD-10-CM | POA: Diagnosis not present

## 2018-02-18 MED ORDER — ESCITALOPRAM OXALATE 20 MG PO TABS: 20.0000 mg | ORAL_TABLET | Freq: Every day | ORAL | 1 refills | Status: DC

## 2018-02-18 MED ORDER — ATORVASTATIN CALCIUM 40 MG PO TABS: 40.0000 mg | ORAL_TABLET | Freq: Every day | ORAL | 1 refills | Status: DC

## 2018-02-18 NOTE — Progress Notes (Signed)
Subjective:    Patient ID: Isaiah Leonard, male    DOB: 1976-07-24, 42 y.o.   MRN: [id number]  HPI  Isaiah Leonard is a 42 yr old male who presents today for follow up.    Hyperlipidemia- continues statin.   Lab Results  Component Value Date   CHOL 189 08/20/2017   HDL 52.80 08/20/2017   LDLCALC 119 (H) 08/20/2017   LDLDIRECT 139.3 04/11/2011   TRIG 88.0 08/20/2017   CHOLHDL 4 08/20/2017   Borderline DM-  Lab Results  Component Value Date   HGBA1C 6.1 08/20/2017   HGBA1C 6.1 10/30/2016   HGBA1C 5.8 02/21/2016   Lab Results  Component Value Date   LDLCALC 119 (H) 08/20/2017   CREATININE 0.78 08/20/2017   Anxiety- maintained on lexapro 16m. Reports that his mood is good. Anxiety is stable.   Review of Systems    see HPI Past Medical History:  Diagnosis Date  . Arthritis   . Diverticulitis   . History of Guillain-Barre syndrome    age 89 . History of shingles   . Pure hypercholesterolemia      Social History   Socioeconomic History  . Marital status: Married    Spouse name: Not on file  . Number of children: 1  . Years of education: Not on file  . Highest education level: Not on file  Occupational History    Employer: [employer]    Comment: Teacher  Social Needs  . Financial resource strain: Not on file  . Food insecurity:    Worry: Not on file    Inability: Not on file  . Transportation needs:    Medical: Not on file    Non-medical: Not on file  Tobacco Use  . Smoking status: Never Smoker  . Smokeless tobacco: Never Used  Substance and Sexual Activity  . Alcohol use: Yes    Comment: Occasional  . Drug use: No  . Sexual activity: Not on file  Lifestyle  . Physical activity:    Days per week: Not on file    Minutes per session: Not on file  . Stress: Not on file  Relationships  . Social connections:    Talks on phone: Not on file    Gets together: Not on file    Attends religious service: Not on file    Active member of club or  organization: Not on file    Attends meetings of clubs or organizations: Not on file    Relationship status: Not on file  . Intimate partner violence:    Fear of current or ex partner: Not on file    Emotionally abused: Not on file    Physically abused: Not on file    Forced sexual activity: Not on file  Other Topics Concern  . 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 deviated septum repair  1991  . SHOULDER SURGERY Left 05/2014   removed bone spurs    Family History  Problem Relation Age of Onset  . Diabetes Mother   . Hypertension Mother   .  Hyperlipidemia Mother   . Hypertension Father   . Hyperlipidemia Father   . Heart attack Father        MI mid 54s  . Multiple sclerosis Sister   . Stroke Brother        history of etoh,a nd drug abuse  . Sudden death Neg Hx     Allergies  Allergen Reactions  . Codeine Itching    hallucinations  . Penicillins Nausea And Vomiting    Told not to take due to Guillain Barre' Syndrome  . Sulfonamide Derivatives Nausea And Vomiting    Current Outpatient Medications on File Prior to Visit  Medication Sig Dispense Refill  . aspirin EC 81 MG tablet Take 81 mg by mouth daily.     No current facility-administered medications on file prior to visit.     BP 124/88 (BP Location: Right Arm, Patient Position: Sitting, Cuff Size: Normal)   Pulse 75   Temp 97.6 F (36.4 C) (Oral)   Resp 16   Ht 5' 8"  (1.727 m)   Wt 182 lb (82.6 kg)   SpO2 98%   BMI 27.67 kg/m    Objective:   Physical Exam  Constitutional: He is oriented to person, place, and time. He appears well-developed and well-nourished. No distress.  HENT:  Head:  Normocephalic and atraumatic.  Cardiovascular: Normal rate and regular rhythm.  No murmur heard. Pulmonary/Chest: Effort normal and breath sounds normal. No respiratory distress. He has no wheezes. He has no rales.  Musculoskeletal: He exhibits no edema.  Neurological: He is alert and oriented to person, place, and time.  Skin: Skin is warm and dry.  Psychiatric: He has a normal mood and affect. His behavior is normal. Thought content normal.          Assessment & Plan:  Anxiety- well controlled on lexapro. Continue same.   Hyperlipidemia- lipid stable, continues statin.   Hyperglycemia- A1C has been stable.  Continue dietary modification. Discussed risks/benefits of aspirin therapy for primary prevention and pt wishes to continue aspirin 38m.

## 2018-08-21 ENCOUNTER — Encounter: Payer: BC Managed Care – PPO | Admitting: Family

## 2018-08-28 ENCOUNTER — Ambulatory Visit (INDEPENDENT_AMBULATORY_CARE_PROVIDER_SITE_OTHER): Payer: BC Managed Care – PPO | Admitting: Family

## 2018-08-28 ENCOUNTER — Encounter: Payer: Self-pay | Admitting: Family

## 2018-08-28 VITALS — BP 129/85 | HR 73 | Temp 97.7°F | Resp 16 | Ht 68.0 in | Wt 163.0 lb

## 2018-08-28 DIAGNOSIS — F419 Anxiety disorder, unspecified: Secondary | ICD-10-CM

## 2018-08-28 DIAGNOSIS — Z0000 Encounter for general adult medical examination without abnormal findings: Secondary | ICD-10-CM | POA: Diagnosis not present

## 2018-08-28 MED ORDER — ATORVASTATIN CALCIUM 40 MG PO TABS: 40.0000 mg | ORAL_TABLET | Freq: Every day | ORAL | 3 refills | Status: DC

## 2018-08-28 MED ORDER — ESCITALOPRAM OXALATE 20 MG PO TABS: 20.0000 mg | ORAL_TABLET | Freq: Every day | ORAL | 3 refills | Status: DC

## 2018-08-28 NOTE — Progress Notes (Signed)
Subjective:    Patient ID: Isaiah Leonard, male    DOB: Oct 05, 1975, 43 y.o.   MRN: [id number]  HPI  Patient presents today for complete physical.  Immunizations: td 2011, declines flu shot Diet: has cut back on sugars/carbs, high protein Wt Readings from Last 3 Encounters:  08/28/18 163 lb (73.9 kg)  02/18/18 182 lb (82.6 kg)  08/20/17 184 lb 3.2 oz (83.6 kg)  Exercise: started doing crossfit Vision:   due Dental: up to date  Anxiety- reports stable symptoms on lexapro.    Review of Systems  Constitutional: Negative for unexpected weight change.  HENT: Negative for hearing loss and rhinorrhea.   Eyes: Negative for visual disturbance.  Respiratory: Negative for cough.   Cardiovascular: Negative for leg swelling.  Gastrointestinal: Negative for constipation and diarrhea.  Genitourinary: Negative for dysuria, frequency and hematuria.  Musculoskeletal: Negative for arthralgias and myalgias.  Skin: Negative for rash.  Neurological: Negative for headaches.  Hematological: Negative for adenopathy.  Psychiatric/Behavioral:       Denies depression/anxiety       Past Medical History:  Diagnosis Date  . Arthritis   . Diverticulitis   . History of Guillain-Barre syndrome    age 71  . History of shingles   . Pure hypercholesterolemia      Social History   Socioeconomic History  . Marital status: Married    Spouse name: Not on file  . Number of children: 1  . Years of education: Not on file  . Highest education level: Not on file  Occupational History    Employer: [employer]    Comment: Teacher  Social Needs  . Financial resource strain: Not on file  . Food insecurity:    Worry: Not on file    Inability: Not on file  . Transportation needs:    Medical: Not on file    Non-medical: Not on file  Tobacco Use  . Smoking status: Never Smoker  . Smokeless tobacco: Never Used  Substance and Sexual Activity  . Alcohol use: Yes    Comment: Occasional  . Drug  use: No  . Sexual activity: Not on file  Lifestyle  . Physical activity:    Days per week: Not on file    Minutes per session: Not on file  . Stress: Not on file  Relationships  . Social connections:    Talks on phone: Not on file    Gets together: Not on file    Attends religious service: Not on file    Active member of club or organization: Not on file    Attends meetings of clubs or organizations: Not on file    Relationship status: Not on file  . Intimate partner violence:    Fear of current or ex partner: Not on file    Emotionally abused: Not on file    Physically abused: Not on file    Forced sexual activity: Not on file  Other Topics Concern  . 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 deviated septum repair  1991  .  SHOULDER SURGERY Left 05/2014   removed bone spurs    Family History  Problem Relation Age of Onset  . Diabetes Mother   . Hypertension Mother   . Hyperlipidemia Mother   . Hypertension Father   . Hyperlipidemia Father   . Heart attack Father        MI mid 61s  . Multiple sclerosis Sister   . Stroke Brother        history of etoh,a nd drug abuse  . Sudden death Neg Hx     Allergies  Allergen Reactions  . Codeine Itching    hallucinations  . Penicillins Nausea And Vomiting    Told not to take due to Guillain Barre' Syndrome  . Sulfonamide Derivatives Nausea And Vomiting    Current Outpatient Medications on File Prior to Visit  Medication Sig Dispense Refill  . aspirin EC 81 MG tablet Take 81 mg by mouth daily.    [name] Kitchen atorvastatin (LIPITOR) 40 MG tablet Take 1 tablet (40 mg total) by mouth daily. 90 tablet 1  . escitalopram (LEXAPRO) 20  MG tablet Take 1 tablet (20 mg total) by mouth daily. 90 tablet 1   No current facility-administered medications on file prior to visit.     BP 129/85 (BP Location: Right Arm, Patient Position: Sitting, Cuff Size: Small)   Pulse 73   Temp 97.7 F (36.5 C) (Oral)   Resp 16   Ht 5' 8"  (1.727 m)   Wt 163 lb (73.9 kg)   SpO2 100%   BMI 24.78 kg/m    Objective:   Physical Exam   Physical Exam  Constitutional: He is oriented to person, place, and time. He appears well-developed and well-nourished. No distress.  HENT:  Head: Normocephalic and atraumatic.  Right Ear: Tympanic membrane and ear canal normal.  Left Ear: Tympanic membrane and ear canal normal.  Mouth/Throat: Oropharynx is clear and moist.  Eyes: Pupils are equal, round, and reactive to light. No scleral icterus.  Neck: Normal range of motion. No thyromegaly present.  Cardiovascular: Normal rate and regular rhythm.   No murmur heard. Pulmonary/Chest: Effort normal and breath sounds normal. No respiratory distress. He has no wheezes. He has no rales. He exhibits no tenderness.  Abdominal: Soft. Bowel sounds are normal. He exhibits no distension and no mass. There is no tenderness. There is no rebound and no guarding.  Musculoskeletal: He exhibits no edema.  Lymphadenopathy:    He has no cervical adenopathy.  Neurological: He is alert and oriented to person, place, and time. He has normal patellar reflexes. He exhibits normal muscle tone. Coordination normal.  Skin: Skin is warm and dry.  Psychiatric: He has a normal mood and affect. His behavior is normal. Judgment and thought content normal.           Assessment & Plan:   Preventative care- commended pt on his diet/exercise and weight loss efforts. Continue same. Obtain routine lab work. Declines flu shot.  Anxiety- stable on lexapro.  Exercise and improved diet also seem to be helping. Continue current dose of lexapro.   Assessment & Plan:  EKG tracing is  personally reviewed.  EKG notes NSR.  No acute changes.

## 2018-08-28 NOTE — Patient Instructions (Signed)
Great work on exercise and weight loss!

## 2018-08-29 LAB — CBC WITH DIFFERENTIAL/PLATELET
ABSOLUTE MONOCYTES: 341 {cells}/uL (ref 200–950)
Basophils Absolute: 83 cells/uL (ref 0–200)
Basophils Relative: 1.5 %
EOS PCT: 1.3 %
Eosinophils Absolute: 72 cells/uL (ref 15–500)
HCT: 41.2 % (ref 38.5–50.0)
Hemoglobin: 14.2 g/dL (ref 13.2–17.1)
Lymphs Abs: 1436 cells/uL (ref 850–3900)
MCH: 29.3 pg (ref 27.0–33.0)
MCHC: 34.5 g/dL (ref 32.0–36.0)
MCV: 84.9 fL (ref 80.0–100.0)
MPV: 10.1 fL (ref 7.5–12.5)
Monocytes Relative: 6.2 %
NEUTROS PCT: 64.9 %
Neutro Abs: 3570 cells/uL (ref 1500–7800)
Platelets: 254 10*3/uL (ref 140–400)
RBC: 4.85 10*6/uL (ref 4.20–5.80)
RDW: 13.1 % (ref 11.0–15.0)
TOTAL LYMPHOCYTE: 26.1 %
WBC: 5.5 10*3/uL (ref 3.8–10.8)

## 2018-08-29 LAB — HEPATIC FUNCTION PANEL
AG Ratio: 1.8 (calc) (ref 1.0–2.5)
ALT: 13 U/L (ref 9–46)
AST: 15 U/L (ref 10–40)
Albumin: 4.6 g/dL (ref 3.6–5.1)
Alkaline phosphatase (APISO): 61 U/L (ref 40–115)
Bilirubin, Direct: 0.1 mg/dL (ref 0.0–0.2)
Globulin: 2.5 g/dL (calc) (ref 1.9–3.7)
Indirect Bilirubin: 0.5 mg/dL (calc) (ref 0.2–1.2)
Total Bilirubin: 0.6 mg/dL (ref 0.2–1.2)
Total Protein: 7.1 g/dL (ref 6.1–8.1)

## 2018-08-29 LAB — URINALYSIS, ROUTINE W REFLEX MICROSCOPIC
Bilirubin Urine: NEGATIVE
Glucose, UA: NEGATIVE
Hgb urine dipstick: NEGATIVE
Ketones, ur: NEGATIVE
Leukocytes, UA: NEGATIVE
Nitrite: NEGATIVE
Protein, ur: NEGATIVE
Specific Gravity, Urine: 1.024 (ref 1.001–1.03)
pH: 7 (ref 5.0–8.0)

## 2018-08-29 LAB — BASIC METABOLIC PANEL
BUN: 17 mg/dL (ref 7–25)
CALCIUM: 9.6 mg/dL (ref 8.6–10.3)
CHLORIDE: 105 mmol/L (ref 98–110)
CO2: 25 mmol/L (ref 20–32)
Creat: 0.88 mg/dL (ref 0.60–1.35)
Glucose, Bld: 98 mg/dL (ref 65–99)
POTASSIUM: 4.4 mmol/L (ref 3.5–5.3)
Sodium: 141 mmol/L (ref 135–146)

## 2018-08-29 LAB — TSH: TSH: 0.49 mIU/L (ref 0.40–4.50)

## 2018-08-29 LAB — LIPID PANEL
Cholesterol: 196 mg/dL (ref <200)
HDL: 72 mg/dL (ref >40)
LDL Cholesterol (Calc): 111 mg/dL (calc) — ABNORMAL HIGH
Non-HDL Cholesterol (Calc): 124 mg/dL (calc) (ref <130)
Total CHOL/HDL Ratio: 2.7 (calc) (ref <5.0)
Triglycerides: 52 mg/dL (ref <150)

## 2018-11-28 ENCOUNTER — Telehealth: Payer: Self-pay | Admitting: Family

## 2018-11-28 MED ORDER — CIPROFLOXACIN HCL 500 MG PO TABS: 500.0000 mg | ORAL_TABLET | Freq: Two times a day (BID) | ORAL | 0 refills | Status: DC

## 2018-11-28 NOTE — Telephone Encounter (Signed)
See mychart.  

## 2018-11-29 ENCOUNTER — Telehealth: Payer: Self-pay | Admitting: Family

## 2018-11-29 NOTE — Telephone Encounter (Signed)
Opened in error

## 2018-11-30 ENCOUNTER — Other Ambulatory Visit: Payer: Self-pay

## 2018-11-30 ENCOUNTER — Telehealth: Payer: Self-pay | Admitting: Family

## 2018-11-30 ENCOUNTER — Ambulatory Visit (INDEPENDENT_AMBULATORY_CARE_PROVIDER_SITE_OTHER): Payer: BC Managed Care – PPO | Admitting: Family

## 2018-11-30 DIAGNOSIS — N200 Calculus of kidney: Secondary | ICD-10-CM | POA: Diagnosis not present

## 2018-11-30 NOTE — Telephone Encounter (Signed)
Opened in error

## 2018-11-30 NOTE — Progress Notes (Signed)
Virtual Visit via Video Note  I connected with Teola Bradley on 11/30/18 at 11:20 AM EDT by a video enabled telemedicine application and verified that I am speaking with the correct person using two identifiers. This visit type was conducted due to national recommendations for restrictions regarding the COVID-19 Pandemic (e.g. social distancing).  This format is felt to be most appropriate for this patient at this time.   I discussed the limitations of evaluation and management by telemedicine and the availability of in person appointments. The patient expressed understanding and agreed to proceed.  Only the patient and myself were on today's video visit. The patient was at home and I was in my office at the time of today's visit.   History of Present Illness:  Patient is a 43 yr old male who presents today with chief complaint of hematuria.  He did reach out over the weekend via mychart and I called in for cipro for him to begin until we could meet today via video visit.  He reports that Monday of last week he woke up with some back pain across his lower back.  He reports that late Thursday night he had some mild discomfort when he started to urinate.  Tinged red at the end of urination.  Same thing happened on Friday. Had a dry clot of blood at the beginning.  Saturday he started abx. Reports resolution of back pain.   He denies hx of kidney stones or family history of kidney stones.   Denies current dysuria, fever or hematuria. Denies current back pain.      Observations/Objective:   Gen: Awake, alert, no acute distress Resp: Breathing is even and non-labored Psych: calm/pleasant demeanor Neuro: Alert and Oriented x 3, + facial symmetry, speech is clear.     Assessment and Plan:   Kidney stone with hematuria- hx consistent with passage of small kidney stone. No residual symptoms at present. Advised pt to complete abx and to call if fever >101, gross hematuria, new/worsening low  back pain.  If these symptoms occur he would need urine culture as well as imaging for stone. verbalizes understanding.  Follow Up Instructions:    I discussed the assessment and treatment plan with the patient. The patient was provided an opportunity to ask questions and all were answered. The patient agreed with the plan and demonstrated an understanding of the instructions.   The patient was advised to call back or seek an in-person evaluation if the symptoms worsen or if the condition fails to improve as anticipated.    Nance Pear, NP

## 2019-09-07 ENCOUNTER — Telehealth: Payer: Self-pay | Admitting: Family

## 2019-09-07 NOTE — Telephone Encounter (Signed)
Medication:  atorvastatin (LIPITOR) 40 MG tablet  escitalopram (LEXAPRO) 20 MG tablet   Has the patient contacted their pharmacy? Yes - expired RX  Preferred Pharmacy (with phone number or street name):  [id number] [address]  [phone] Fax:  [phone]    Pt last [facility] Wing1/2020 Scheduled for 09/28/2019 for cpe

## 2019-09-08 MED ORDER — ATORVASTATIN CALCIUM 40 MG PO TABS: 40.0000 mg | ORAL_TABLET | Freq: Every day | ORAL | 3 refills | Status: DC

## 2019-09-08 NOTE — Telephone Encounter (Signed)
Refill sent.

## 2019-09-09 ENCOUNTER — Other Ambulatory Visit: Payer: Self-pay | Admitting: Family

## 2019-09-28 ENCOUNTER — Encounter: Payer: BC Managed Care – PPO | Admitting: Family

## 2019-10-16 ENCOUNTER — Ambulatory Visit: Payer: BC Managed Care – PPO | Attending: Internal Medicine

## 2019-10-16 DIAGNOSIS — Z23 Encounter for immunization: Secondary | ICD-10-CM | POA: Insufficient documentation

## 2019-10-16 NOTE — Progress Notes (Signed)
   Covid-19 Vaccination Clinic  Name:  Isaiah Leonard    MRN: [id number] DOB: [birth year]  10/16/2019  Mr. Eggenberger was observed post Covid-19 immunization for 15 minutes without incidence. He was provided with Vaccine Information Sheet and instruction to access the V-Safe system.   Mr. Sluka was instructed to call 911 with any severe reactions post vaccine: [name] Kitchen Difficulty breathing  . Swelling of your face and throat  . A fast heartbeat  . A bad rash all over your body  . Dizziness and weakness    Immunizations Administered    Name Date Dose VIS Date Route   Pfizer COVID-19 Vaccine 10/16/2019  1:15 PM 0.3 mL 07/30/2019 Intramuscular   Manufacturer: [place]   Lot: [id number]   [place]: [id number]

## 2019-10-29 ENCOUNTER — Encounter: Payer: Self-pay | Admitting: Family

## 2019-10-29 ENCOUNTER — Ambulatory Visit (INDEPENDENT_AMBULATORY_CARE_PROVIDER_SITE_OTHER): Payer: BC Managed Care – PPO | Admitting: Family

## 2019-10-29 ENCOUNTER — Other Ambulatory Visit: Payer: Self-pay

## 2019-10-29 VITALS — BP 135/83 | HR 66 | Temp 97.1°F | Resp 16 | Ht 68.0 in | Wt 167.0 lb

## 2019-10-29 DIAGNOSIS — Z0000 Encounter for general adult medical examination without abnormal findings: Secondary | ICD-10-CM | POA: Diagnosis not present

## 2019-10-29 DIAGNOSIS — F419 Anxiety disorder, unspecified: Secondary | ICD-10-CM | POA: Diagnosis not present

## 2019-10-29 DIAGNOSIS — R739 Hyperglycemia, unspecified: Secondary | ICD-10-CM | POA: Diagnosis not present

## 2019-10-29 DIAGNOSIS — E785 Hyperlipidemia, unspecified: Secondary | ICD-10-CM | POA: Diagnosis not present

## 2019-10-29 NOTE — Patient Instructions (Addendum)
Please complete lab work prior to leaving. Keep up the great work with healthy diet, exercise and weight loss.    Preventive Care 46-46 Years Old, Male Preventive care refers to lifestyle choices and visits with your health care provider that can promote health and wellness. This includes:  A yearly physical exam. This is also called an annual well check.  Regular dental and eye exams.  Immunizations.  Screening for certain conditions.  Healthy lifestyle choices, such as eating a healthy diet, getting regular exercise, not using drugs or products that contain nicotine and tobacco, and limiting alcohol use. What can I expect for my preventive care visit? Physical exam Your health care provider will check:  Height and weight. These may be used to calculate body mass index (BMI), which is a measurement that tells if you are at a healthy weight.  Heart rate and blood pressure.  Your skin for abnormal spots. Counseling Your health care provider may ask you questions about:  Alcohol, tobacco, and drug use.  Emotional well-being.  Home and relationship well-being.  Sexual activity.  Eating habits.  Work and work Statistician. What immunizations do I need?  Influenza (flu) vaccine  This is recommended every year. Tetanus, diphtheria, and pertussis (Tdap) vaccine  You may need a Td booster every 10 years. Varicella (chickenpox) vaccine  You may need this vaccine if you have not already been vaccinated. Zoster (shingles) vaccine  You may need this after age 46. if you have not already been vaccinated. Zoster (shingles) vaccine  You may need this after age 46. Measles, mumps, and rubella (MMR) vaccine  You may need at least one dose of MMR if you were born in 1957 or later. You may also need a second dose. Pneumococcal conjugate (PCV13) vaccine  You may need this if you have certain conditions and were not previously vaccinated. Pneumococcal polysaccharide (PPSV23) vaccine  You may need one or two doses if you smoke cigarettes or if you have certain  conditions. Meningococcal conjugate (MenACWY) vaccine  You may need this if you have certain conditions. Hepatitis A vaccine  You may need this if you have certain conditions or if you travel or work in places where you may be exposed to hepatitis A. Hepatitis B vaccine  You may need this if you have certain conditions or if you travel or work in places where you may be exposed to hepatitis B. Haemophilus influenzae type b (Hib) vaccine  You may need this if you have certain risk factors. Human papillomavirus (HPV) vaccine  If recommended by your health care provider, you may need three doses over 6 months. You may receive vaccines as individual doses or as more than one vaccine together in one shot (combination vaccines). Talk with your health care provider about the risks and benefits of combination vaccines. What tests do I need? Blood tests  Lipid and cholesterol levels. These may be checked every 5 years, or more frequently if you are over 33 years old.  Hepatitis C test.  Hepatitis B test. Screening  Lung cancer screening. You may have this screening every year starting at age 46 if you have a 30-pack-year history of smoking and currently smoke or have quit within the past 15 years.  Prostate cancer screening. Recommendations will vary depending on your family history and other risks.  Colorectal cancer screening. All adults should have this screening starting at age 46 and continuing until age 69. Your health care provider may recommend screening at age 46 if you are at increased risk. You will have tests every 1-10 years, depending on your results and  the type of screening test.  Diabetes screening. This is done by checking your blood sugar (glucose) after you have not eaten for a while (fasting). You may have this done every 1-3 years.  Sexually transmitted disease (STD) testing. Follow these instructions at home: Eating and drinking  Eat a diet that includes fresh  fruits and vegetables, whole grains, lean protein, and low-fat dairy products.  Take vitamin and mineral supplements as recommended by your health care provider.  Do not drink alcohol if your health care provider tells you not to drink.  If you drink alcohol: ? Limit how much you have to 0-2 drinks a day. ? Be aware of how much alcohol is in your drink. In the [place] one drink equals one 12 oz bottle of beer (355 mL), one 5 oz glass of wine (148 mL), or one 1 oz glass of hard liquor (44 mL). Lifestyle  Take daily care of your teeth and gums.  Stay active. Exercise for at least 30 minutes on 5 or more days each week.  Do not use any products that contain nicotine or tobacco, such as cigarettes, e-cigarettes, and chewing tobacco. If you need help quitting, ask your health care provider.  If you are sexually active, practice safe sex. Use a condom or other form of protection to prevent STIs (sexually transmitted infections).  Talk with your health care provider about taking a low-dose aspirin every day starting at age 46. What's next?  Go to your health care provider once a year for a well check visit.  Ask your health care provider how often you should have your eyes and teeth checked.  Stay up to date on all vaccines. This information is not intended to replace advice given to you by your health care provider. Make sure you discuss any questions you have with your health care provider. Document Revised: 07/30/2018 Document Reviewed: 07/30/2018 Elsevier Patient Education  2020 [employer].

## 2019-10-29 NOTE — Progress Notes (Signed)
Subjective:    Patient ID: Isaiah Leonard, male    DOB: 26-Nov-1975, 44 y.o.   MRN: [id number]  HPI  Patient presents today for complete physical.  Immunizations:  Due for tetanus next visit Diet: healthy Exercise: crossfit 4-5 times a week Dental: up to date Vision: up to date  Hyperlipidemia- maintained on lipitor 89m.  Denies myalgia.    Lab Results  Component Value Date   CHOL 196 08/28/2018   HDL 72 08/28/2018   LDLCALC 111 (H) 08/28/2018   LDLDIRECT 139.3 04/11/2011   TRIG 52 08/28/2018   CHOLHDL 2.7 08/28/2018   Borderline DM2-  Working on [employer]  Wt Readings from Last 3 Encounters:  10/29/19 167 lb (75.8 kg)  08/28/18 163 lb (73.9 kg)  02/18/18 182 lb (82.6 kg)    Lab Results  Component Value Date   HGBA1C 6.1 08/20/2017   HGBA1C 6.1 10/30/2016   HGBA1C 5.8 02/21/2016   Lab Results  Component Value Date   LDLCALC 111 (H) 08/28/2018   CREATININE 0.88 08/28/2018   Anxiety- maintained on lexapro. Reports feeling well on lexapro.    Review of Systems  Constitutional: Negative for unexpected weight change.  HENT: Negative for hearing loss.   Eyes: Negative for visual disturbance.  Respiratory: Negative for cough and shortness of breath.   Cardiovascular: Negative for chest pain.  Gastrointestinal: Negative for diarrhea, nausea and vomiting.  Genitourinary: Negative for dysuria and frequency.  Musculoskeletal: Negative for arthralgias and myalgias.  Skin: Negative for rash.  Neurological: Negative for headaches.  Hematological: Negative for adenopathy.  Psychiatric/Behavioral:       Denies depression       Past Medical History:  Diagnosis Date  . Arthritis   . Diverticulitis   . History of Guillain-Barre syndrome    age 44 . History of shingles   . Pure hypercholesterolemia      Social History   Socioeconomic History  . Marital status: Married    Spouse name: Not on file  . Number of children: 1  . Years of education: Not on  file  . Highest education level: Not on file  Occupational History    Employer: [employer]    Comment: Teacher  Tobacco Use  . Smoking status: Never Smoker  . Smokeless tobacco: Never Used  Substance and Sexual Activity  . Alcohol use: Yes    Comment: Occasional  . Drug use: No  . Sexual activity: Not on file  Other Topics Concern  . 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 Difficulty of Paying Living Expenses:   Food Insecurity:   . Worried About RCharity fundraiserin the Last Year:   . RArboriculturistin the Last Year:   Transportation Needs:   . LFilm/video editor(Medical):   .[name] KitchenLack of Transportation (Non-Medical):   Physical Activity:   . Days of Exercise per Week:   . Minutes of Exercise per Session:   Stress:   . Feeling of Stress :   Social Connections:   .  Frequency of Communication with Friends and Family:   . Frequency of Social Gatherings with Friends and Family:   . Attends Religious Services:   . Active Member of Clubs or Organizations:   . Attends Archivist Meetings:   [name] Kitchen Marital Status:   Intimate Partner Violence:   . Fear of Current or Ex-Partner:   . Emotionally Abused:   [name] Kitchen Physically Abused:   . Sexually Abused:     Past Surgical History:  Procedure Laterality Date  . deviated septum repair  1991  . SHOULDER SURGERY Left 05/2014   removed bone spurs    Family History  Problem Relation Age of Onset  . Diabetes Mother   . Hypertension Mother   . Hyperlipidemia Mother   . Hypertension Father   . Hyperlipidemia Father   . Heart attack Father        MI mid 31s  . Multiple sclerosis Sister   . Stroke  Brother        history of etoh,a nd drug abuse  . Sudden death Neg Hx     Allergies  Allergen Reactions  . Codeine Itching    hallucinations  . Penicillins Nausea And Vomiting    Told not to take due to Guillain Barre' Syndrome  . Sulfonamide Derivatives Nausea And Vomiting    Current Outpatient Medications on File Prior to Visit  Medication Sig Dispense Refill  . aspirin EC 81 MG tablet Take 81 mg by mouth daily.    [name] Kitchen atorvastatin (LIPITOR) 40 MG tablet Take 1 tablet (40 mg total) by mouth daily. 90 tablet 3  . escitalopram (LEXAPRO) 20 MG tablet TAKE 1 TABLET (20 MG TOTAL) BY MOUTH DAILY. 90 tablet 1  . [DISCONTINUED] escitalopram (LEXAPRO) 20 MG tablet Take 1 tablet (20 mg total) by mouth daily. 90 tablet 3   No current facility-administered medications on file prior to visit.    BP 135/83 (BP Location: Right Arm, Patient Position: Sitting, Cuff Size: Small)   Pulse 66   Temp (!) 97.1 F (36.2 C) (Temporal)   Resp 16   Ht 5' 8"  (1.727 m)   Wt 167 lb (75.8 kg)   SpO2 100%   BMI 25.39 kg/m    Objective:   Physical Exam  Physical Exam  Constitutional: He is oriented to person, place, and time. He appears well-developed and well-nourished. No distress.  HENT:  Head: Normocephalic and atraumatic.  Right Ear: Tympanic membrane and ear canal normal.  Left Ear: Tympanic membrane and ear canal normal.  Mouth/Throat: not examined- pt wearing mask Eyes: Pupils are equal, round, and reactive to light. No scleral icterus.  Neck: Normal range of motion. No thyromegaly present.  Cardiovascular: Normal rate and regular rhythm.   No murmur heard. Pulmonary/Chest: Effort normal and breath sounds normal. No respiratory distress. He has no wheezes. He has no rales. He exhibits no tenderness.  Abdominal: Soft. Bowel sounds are normal. He exhibits no distension and no mass. There is no tenderness. There is no rebound and no guarding.  Musculoskeletal: He exhibits no edema.    Lymphadenopathy:    He has no cervical adenopathy.  Neurological: He is alert and oriented to person, place, and time. He has normal patellar reflexes. He exhibits normal muscle tone. Coordination normal.  Skin: Skin is warm and dry.  Psychiatric: He has a normal mood and affect. His behavior is normal. Judgment and thought content normal.           Assessment & Plan:  Preventative care- he is working hard at [employer] and regular exercise. I commended him on this. He is due for tetanus but will defer since he is in the middle of the covid series.  Obtain routine lab work.  Anxiety- stable on lexapro. Continue same.  Hyperlipidemia- tolerating statin, obtain follow up lipid panel.  Hyperglycemia- he is down 15 pounds since the last time his A1C was checked.  Hopefully A1C has come down as well.  This visit occurred during the SARS-CoV-2 public health emergency.  Safety protocols were in place, including screening questions prior to the visit, additional usage of staff PPE, and extensive cleaning of exam room while observing appropriate contact time as indicated for disinfecting solutions.             Assessment & Plan:          Assessment & Plan:

## 2019-10-30 LAB — COMPREHENSIVE METABOLIC PANEL
AG Ratio: 1.7 (calc) (ref 1.0–2.5)
ALT: 12 U/L (ref 9–46)
AST: 16 U/L (ref 10–40)
Albumin: 4.3 g/dL (ref 3.6–5.1)
Alkaline phosphatase (APISO): 53 U/L (ref 36–130)
BUN: 19 mg/dL (ref 7–25)
CO2: 25 mmol/L (ref 20–32)
Calcium: 9.6 mg/dL (ref 8.6–10.3)
Chloride: 103 mmol/L (ref 98–110)
Creat: 0.85 mg/dL (ref 0.60–1.35)
Globulin: 2.5 g/dL (calc) (ref 1.9–3.7)
Glucose, Bld: 82 mg/dL (ref 65–99)
Potassium: 4.5 mmol/L (ref 3.5–5.3)
Sodium: 139 mmol/L (ref 135–146)
Total Bilirubin: 0.6 mg/dL (ref 0.2–1.2)
Total Protein: 6.8 g/dL (ref 6.1–8.1)

## 2019-10-30 LAB — LIPID PANEL
Cholesterol: 150 mg/dL (ref <200)
HDL: 67 mg/dL (ref >=40)
LDL Cholesterol (Calc): 70 mg/dL (calc)
Non-HDL Cholesterol (Calc): 83 mg/dL (calc) (ref <130)
Total CHOL/HDL Ratio: 2.2 (calc) (ref <5.0)
Triglycerides: 58 mg/dL (ref <150)

## 2019-10-30 LAB — CBC WITH DIFFERENTIAL/PLATELET
Absolute Monocytes: 410 cells/uL (ref 200–950)
Basophils Absolute: 68 cells/uL (ref 0–200)
Basophils Relative: 1.2 %
Eosinophils Absolute: 108 cells/uL (ref 15–500)
Eosinophils Relative: 1.9 %
HCT: 39.1 % (ref 38.5–50.0)
Hemoglobin: 13.5 g/dL (ref 13.2–17.1)
Lymphs Abs: 1408 cells/uL (ref 850–3900)
MCH: 29.9 pg (ref 27.0–33.0)
MCHC: 34.5 g/dL (ref 32.0–36.0)
MCV: 86.5 fL (ref 80.0–100.0)
MPV: 10.2 fL (ref 7.5–12.5)
Monocytes Relative: 7.2 %
Neutro Abs: 3705 cells/uL (ref 1500–7800)
Neutrophils Relative %: 65 %
Platelets: 240 10*3/uL (ref 140–400)
RBC: 4.52 10*6/uL (ref 4.20–5.80)
RDW: 12.5 % (ref 11.0–15.0)
Total Lymphocyte: 24.7 %
WBC: 5.7 10*3/uL (ref 3.8–10.8)

## 2019-10-30 LAB — HEMOGLOBIN A1C
Hgb A1c MFr Bld: 5.7 % of total Hgb — ABNORMAL HIGH (ref <5.7)
Mean Plasma Glucose: 117 (calc)
eAG (mmol/L): 6.5 (calc)

## 2019-10-30 LAB — TSH: TSH: 0.66 mIU/L (ref 0.40–4.50)

## 2019-11-06 ENCOUNTER — Ambulatory Visit: Payer: BC Managed Care – PPO | Attending: Internal Medicine

## 2019-11-06 DIAGNOSIS — Z23 Encounter for immunization: Secondary | ICD-10-CM

## 2019-11-06 NOTE — Progress Notes (Signed)
   Covid-19 Vaccination Clinic  Name:  Isaiah Leonard    MRN: [id number] DOB: 1975/09/12  11/06/2019  [name] was observed post Covid-19 immunization for 15 minutes without incident. He was provided with Vaccine Information Sheet and instruction to access the V-Safe system.   [name] was instructed to call 911 with any severe reactions post vaccine: [name] Kitchen Difficulty breathing  . Swelling of face and throat  . A fast heartbeat  . A bad rash all over body  . Dizziness and weakness   Immunizations Administered    Name Date Dose VIS Date Route   Pfizer COVID-19 Vaccine 11/06/2019 10:33 AM 0.3 mL 07/30/2019 Intramuscular   Manufacturer: [place]   Lot: [id number]   [place]: [id number]

## 2020-03-11 ENCOUNTER — Other Ambulatory Visit: Payer: Self-pay | Admitting: Family

## 2020-08-14 ENCOUNTER — Ambulatory Visit: Payer: BC Managed Care – PPO | Attending: Internal Medicine

## 2020-08-14 ENCOUNTER — Other Ambulatory Visit (HOSPITAL_BASED_OUTPATIENT_CLINIC_OR_DEPARTMENT_OTHER): Payer: Self-pay | Admitting: Internal Medicine

## 2020-08-14 DIAGNOSIS — Z23 Encounter for immunization: Secondary | ICD-10-CM

## 2020-08-14 MED FILL — PFIZER-BIONTECH COVID-19 VA: 30 | 21 days supply | Qty: 0 | Fill #0

## 2020-08-14 NOTE — Progress Notes (Signed)
   Covid-19 Vaccination Clinic  Name:  RED MANDT    MRN: [id number] DOB: Dec 26, 1975  08/14/2020  Mr. Gowan was observed post Covid-19 immunization for 15 minutes without incident. He was provided with Vaccine Information Sheet and instruction to access the V-Safe system.   Mr. Kroening was instructed to call 911 with any severe reactions post vaccine: [name] Kitchen Difficulty breathing  . Swelling of face and throat  . A fast heartbeat  . A bad rash all over body  . Dizziness and weakness   Immunizations Administered    Name Date Dose VIS Date Route   Pfizer COVID-19 Vaccine 08/14/2020  9:13 AM 0.3 mL 06/07/2020 Intramuscular   Manufacturer: [place]   Lot: [id number]   [place]: [id number]

## 2020-09-17 ENCOUNTER — Other Ambulatory Visit: Payer: Self-pay | Admitting: Family

## 2020-10-01 ENCOUNTER — Other Ambulatory Visit: Payer: Self-pay | Admitting: Family

## 2021-03-24 ENCOUNTER — Other Ambulatory Visit: Payer: Self-pay | Admitting: Family

## 2021-04-08 ENCOUNTER — Other Ambulatory Visit: Payer: Self-pay | Admitting: Family

## 2021-05-24 ENCOUNTER — Telehealth: Payer: Self-pay | Admitting: Family

## 2021-05-24 ENCOUNTER — Telehealth: Payer: Self-pay | Admitting: *Deleted

## 2021-05-24 NOTE — Telephone Encounter (Signed)
Caller Name [place] Phone Number [phone] Patient Name Isaiah Leonard Patient DOB 1976/08/19 Call Type Message Only Information Provided Reason for Call Medication Question / Request Initial Comment Caller states that he would like to speak to someone about his Lexapro that he takes for anxiety. He states over the last month or so it doesn't seem to be working. He would like to try something different if possible. Additional Comment Caller declined triage and stated that he would just like a message sent to the office requesting a call back to make the change in medication if possible. Disp. Time Disposition Final User 05/24/2021 7:07:28 AM General Information Provided Yes Wynema Birch

## 2021-05-24 NOTE — Telephone Encounter (Signed)
This information was sent to provider in another message

## 2021-05-24 NOTE — Telephone Encounter (Signed)
Patient stated the medication he is currently on is not working for him escitalopram (LEXAPRO) 20 MG tablet & would like to know if theres an alternate medication that he can try in place of the current one.

## 2021-05-24 NOTE — Telephone Encounter (Signed)
Patient scheduled to come in next week

## 2021-05-24 NOTE — Telephone Encounter (Signed)
He should schedule an office visit please to further discuss.

## 2021-05-29 ENCOUNTER — Ambulatory Visit: Payer: BC Managed Care – PPO | Admitting: Family

## 2021-05-29 ENCOUNTER — Other Ambulatory Visit: Payer: Self-pay

## 2021-05-29 VITALS — BP 149/92 | HR 67 | Temp 97.8°F | Resp 16 | Ht 68.0 in | Wt 174.0 lb

## 2021-05-29 DIAGNOSIS — Z8669 Personal history of other diseases of the nervous system and sense organs: Secondary | ICD-10-CM | POA: Diagnosis not present

## 2021-05-29 DIAGNOSIS — F419 Anxiety disorder, unspecified: Secondary | ICD-10-CM

## 2021-05-29 DIAGNOSIS — R030 Elevated blood-pressure reading, without diagnosis of hypertension: Secondary | ICD-10-CM | POA: Insufficient documentation

## 2021-05-29 MED ORDER — SERTRALINE HCL 50 MG PO TABS: ORAL_TABLET | ORAL | 0 refills | Status: DC

## 2021-05-29 NOTE — Assessment & Plan Note (Signed)
BP Readings from Last 3 Encounters:  05/29/21 (!) 149/92  10/29/19 135/83  08/28/18 129/85   BP elevated today.  Plan to repeat at his follow up in 3 weeks.  If still elevated will need to consider antihypertensive.

## 2021-05-29 NOTE — Progress Notes (Signed)
Subjective:   By signing my name below, I, Lyric Barr-McArthur, attest that this documentation has been prepared under the direction and in the presence of Debbrah Alar, NP, 05/29/2021   Patient ID: Isaiah Leonard, male    DOB: 1975/08/25, 45 y.o.   MRN: [id number]  Chief Complaint  Patient presents with   Anxiety    Patient complains of increased anxiety    HPI Patient is in today for an office visit.   Anxiety: During his last visit he was taking 20 mg Lexapro and has since been compliant with this usage. He was seeing an approvement with his anxiety symptoms with the use of 20 mg Lexapro. He notes that his obsessive tendencies such as walking around the house multiple times to double check everything began dwindling with the medication. He now notes that lately he has been seeing the anxiety and obsessive tendencies coming back. He does note that he has had increased stressors recently with work. He denies any panic attacks. He would like to consider switching medications.  Exercise: He does Crossfit 5-6 days a week in the morning for one hour.  Blood pressure: His blood pressure was elevated today in the office but he notes he just got done drinking a large coffee which he thinks could be a contributing factor.  BP Readings from Last 3 Encounters:  05/29/21 (!) 149/92  10/29/19 135/83  08/28/18 129/85    Immunizations: He would like to get his flu shot today in the office.   Health Maintenance Due  Topic Date Due   HIV Screening  Never done   COLONOSCOPY (Pts 45-83yr Insurance coverage will need to be confirmed)  Never done   TETANUS/TDAP  01/20/2020   INFLUENZA VACCINE  03/19/2021    Past Medical History:  Diagnosis Date   Arthritis    Diverticulitis    History of Guillain-Barre syndrome    age 45  History of shingles    Pure hypercholesterolemia     Past Surgical History:  Procedure Laterality Date   deviated septum repair  1991   SHOULDER SURGERY Left  05/2014   removed bone spurs    Family History  Problem Relation Age of Onset   Diabetes Mother    Hypertension Mother    Hyperlipidemia Mother    Hypertension Father    Hyperlipidemia Father    Heart attack Father        MI mid 455s  Multiple sclerosis Sister    Stroke Brother        history of etoh,a nd drug abuse   Sudden death Neg Hx     Social History   Socioeconomic History   Marital status: Married    Spouse name: Not on file   Number of children: 1   Years of education: Not on file   Highest education level: Not on file  Occupational History    Employer: [employer]    Comment: Teacher  Tobacco Use   Smoking status: Never   Smokeless tobacco: Never  Substance and Sexual Activity   Alcohol use: Yes    Comment: Occasional   Drug use: No   Sexual activity: Not on file  Other Topics Concern   Not on file  Social History Narrative   Married   [place]- born 2004   Never Smoked   Alcohol use-yes    Occupation:  History tPharmacist, 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     atorvastatin (LIPITOR) 40 MG tablet TAKE 1 TABLET BY MOUTH EVERY DAY 30 tablet 0   COVID-19 mRNA vaccine, Pfizer, 30 MCG/0.3ML injection INJECT AS DIRECTED .3 mL 0   escitalopram (LEXAPRO) 20 MG tablet TAKE 1 TABLET BY MOUTH EVERY DAY 30 tablet 0   No facility-administered medications prior to visit.    Allergies  Allergen Reactions   Codeine Itching    hallucinations    Penicillins Nausea And Vomiting    Told not to take due to Guillain Barre' Syndrome   Sulfonamide Derivatives Nausea And Vomiting    Review of Systems  Psychiatric/Behavioral:  The patient is nervous/anxious (Increased recently).       Objective:    Physical Exam Constitutional:      General: He is not in acute distress.    Appearance: Normal appearance. He is not ill-appearing.  HENT:     Head: Normocephalic and atraumatic.     Right Ear: External ear normal.     Left Ear: External ear normal.  Eyes:     Extraocular Movements: Extraocular movements intact.     Pupils: Pupils are equal, round, and reactive to light.  Neurological:     Mental Status: He is alert and oriented to person, place, and time.  Psychiatric:        Behavior: Behavior normal.        Judgment: Judgment normal.    BP (!) 149/92 (BP Location: Right Arm, Patient Position: Sitting, Cuff Size: Small)   Pulse 67   Temp 97.8 F (36.6 C) (Oral)   Resp 16   Ht 5' 8"  (1.727 m)   Wt 174 lb (78.9 kg)   SpO2 100%   BMI 26.46 kg/m  Wt Readings from Last 3 Encounters:  05/29/21 174 lb (78.9 kg)  10/29/19 167 lb (75.8 kg)  08/28/18 163 lb (73.9 kg)       Assessment & Plan:   Problem List Items Addressed This Visit       Unprioritized   History of Guillain-Barre syndrome    Opted not to give flu shot due to his history.       Elevated blood pressure reading - Primary    BP Readings from Last 3 Encounters:  05/29/21 (!) 149/92  10/29/19 135/83  08/28/18 129/85  BP elevated today.  Plan to repeat at his follow up in 3 weeks.  If still elevated will need to consider antihypertensive.       Anxiety    Worsening OCD symptoms.  We discussed referral for counseling versus medication change. [name] KitchenHe is interested in medication adjustment Patient is advised as follows.   Start zoloft 44m 1/2 tab once daily for 1 week, then increase to a full tab once daily on week two. Decrease lexapro to 1/2 tab once  daily then stop.  Try to increase amount of sleep. Continue regular exercise.      Relevant  Medications   sertraline (ZOLOFT) 50 MG tablet   Meds ordered this encounter  Medications   sertraline (ZOLOFT) 50 MG tablet    Sig: 1/2 tab once daily for 1 week then increase to a full tab once daily    Dispense:  30 tablet    Refill:  0    Order Specific Question:   Supervising Provider    Answer:   Mosie Lukes [[id number]]    I, Debbrah Alar, NP, personally preformed the services described in this documentation.  All medical record entries made by the scribe were at my direction and in my presence.  I have reviewed the chart and discharge instructions (if applicable) and agree that the record reflects my personal performance and is accurate and complete. 05/29/2021  I,Lyric Barr-McArthur,acting as a Education administrator for Nance Pear, NP.,have documented all relevant documentation on the behalf of Nance Pear, NP,as directed by  Nance Pear, NP while in the presence of Nance Pear, NP.  Nance Pear, NP

## 2021-05-29 NOTE — Patient Instructions (Signed)
Start zoloft 6m 1/2 tab once daily for 1 week, then increase to a full tab once daily on week two. Decrease lexapro to 1/2 tab once daily then stop.

## 2021-05-29 NOTE — Assessment & Plan Note (Addendum)
Worsening OCD symptoms.  We discussed referral for counseling versus medication change. [name] KitchenHe is interested in medication adjustment Patient is advised as follows.   Start zoloft 10m 1/2 tab once daily for 1 week, then increase to a full tab once daily on week two. Decrease lexapro to 1/2 tab once daily then stop.  Try to increase amount of sleep. Continue regular exercise.

## 2021-05-29 NOTE — Assessment & Plan Note (Signed)
Opted not to give flu shot due to his history.

## 2021-06-19 ENCOUNTER — Encounter: Payer: Self-pay | Admitting: Family

## 2021-06-19 ENCOUNTER — Ambulatory Visit: Payer: BC Managed Care – PPO | Admitting: Family

## 2021-06-19 ENCOUNTER — Other Ambulatory Visit: Payer: Self-pay

## 2021-06-19 VITALS — BP 134/70 | HR 79 | Temp 98.0°F | Ht 68.0 in | Wt 174.0 lb

## 2021-06-19 DIAGNOSIS — F429 Obsessive-compulsive disorder, unspecified: Secondary | ICD-10-CM

## 2021-06-19 DIAGNOSIS — R030 Elevated blood-pressure reading, without diagnosis of hypertension: Secondary | ICD-10-CM

## 2021-06-19 MED ORDER — SERTRALINE HCL 50 MG PO TABS: 50.0000 mg | ORAL_TABLET | Freq: Every day | ORAL | 1 refills | Status: DC

## 2021-06-19 NOTE — Assessment & Plan Note (Signed)
Stable/improved with transition from lexapro to zoloft 50 mg. Will continue zoloft 50 mg.

## 2021-06-19 NOTE — Assessment & Plan Note (Signed)
Repeat BP today is better. Will continue to monitor.

## 2021-06-19 NOTE — Progress Notes (Signed)
Subjective:   By signing my name below, I, Lyric Barr-McArthur, attest that this documentation has been prepared under the direction and in the presence of Debbrah Alar, NP, 06/19/2021   Patient ID: Isaiah Leonard, male    DOB: June 26, 1976, 45 y.o.   MRN: [id number]  Chief Complaint  Patient presents with   Hypertension    3 week f/u     HPI Patient is in today for an office visit.  Blood pressure: His blood pressure is looking in good range today during this visit. He notes he has cut back on his coffee intake and increased his water intake. He is compliant in taking his 40 mg Lipitor to improve his blood pressure.  BP Readings from Last 3 Encounters:  06/19/21 134/70  05/29/21 (!) 149/92  10/29/19 135/83  OCD: During his last visit he complained of OCD tendencies and noted that he would check all of the faucets over and over again, the locks on the doors, windows, etc. He was taken off of 20 mg Lexapro and prescribed 50 mg Zoloft. He notes improvement of his obsessive tendencies with the use of Zoloft.     Health Maintenance Due  Topic Date Due   HIV Screening  Never done   COLONOSCOPY (Pts 45-47yr Insurance coverage will need to be confirmed)  Never done   TETANUS/TDAP  01/20/2020   COVID-19 Vaccine (4 - Booster for [employer]) 10/09/2020   INFLUENZA VACCINE  03/19/2021    Past Medical History:  Diagnosis Date   Arthritis    Diverticulitis    History of Guillain-Barre syndrome    age 45  History of shingles    Pure hypercholesterolemia     Past Surgical History:  Procedure Laterality Date   deviated septum repair  1991   SHOULDER SURGERY Left 05/2014   removed bone spurs    Family History  Problem Relation Age of Onset   Diabetes Mother    Hypertension Mother    Hyperlipidemia Mother    Hypertension Father    Hyperlipidemia Father    Heart attack Father        MI mid 424s  Multiple sclerosis Sister    Stroke Brother        history of etoh,a  nd drug abuse   Sudden death Neg Hx     Social History   Socioeconomic History   Marital status: Married    Spouse name: Not on file   Number of children: 1   Years of education: Not on file   Highest education level: Not on file  Occupational History    Employer: [employer]    Comment: Teacher  Tobacco Use   Smoking status: Never   Smokeless tobacco: Never  Substance and Sexual Activity   Alcohol use: Yes    Comment: Occasional   Drug use: No   Sexual activity: Not on file  Other Topics Concern   Not on file  Social History Narrative   Married   [place]- born 2004   Never Smoked   Alcohol use-yes    Occupation:  History tPharmacist, 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     atorvastatin (LIPITOR) 40 MG tablet TAKE 1 TABLET BY MOUTH EVERY DAY 30 tablet 0   sertraline (ZOLOFT) 50 MG tablet 1/2 tab once daily for 1 week then increase to a full tab once daily (Patient taking differently: 50 mg daily. 1/2 tab once daily for 1 week then increase to a full tab once daily) 30 tablet 0   COVID-19 mRNA vaccine, Pfizer, 30 MCG/0.3ML injection INJECT AS DIRECTED .3 mL 0   No facility-administered medications prior to visit.    Allergies  Allergen Reactions   Codeine Itching    hallucinations   Penicillins Nausea And Vomiting    Told not to take due to Guillain Barre' Syndrome   Sulfonamide Derivatives Nausea And Vomiting    Review of Systems  Psychiatric/Behavioral:          (+) obsessive habits       Objective:    Physical Exam Constitutional:      General: He is not in acute distress.    Appearance: Normal appearance. He is not ill-appearing.  HENT:     Head: Normocephalic and atraumatic.     Right Ear: External ear normal.     Left Ear: External ear normal.  Eyes:     Extraocular Movements: Extraocular movements intact.     Pupils: Pupils are equal, round, and reactive to light.  Cardiovascular:     Rate and Rhythm: Normal rate and regular rhythm.     Heart sounds: Normal heart sounds. No murmur heard.   No gallop.  Pulmonary:     Effort: Pulmonary effort is normal. No respiratory distress.     Breath sounds: Normal breath sounds. No wheezing or rales.  Skin:    General: Skin is warm and dry.  Neurological:     Mental Status: He is alert and oriented to person, place, and time.  Psychiatric:        Behavior: Behavior normal.        Judgment: Judgment normal.    BP 134/70   Pulse 79   Temp 98 F (36.7 C) (Oral)   Ht 5' 8"  (1.727 m)   Wt 174 lb (78.9 kg)   SpO2 99%   BMI 26.46 kg/m  Wt Readings from Last 3 Encounters:  06/19/21 174 lb (78.9 kg)  05/29/21 174 lb (78.9 kg)  10/29/19 167 lb (75.8 kg)       Assessment & Plan:   Problem List Items Addressed This Visit       Unprioritized   OCD (obsessive compulsive disorder) - Primary    Stable/improved with transition from lexapro to zoloft 50 mg. Will continue zoloft 50 mg.       Relevant Medications   sertraline (ZOLOFT) 50 MG tablet   Elevated blood pressure reading    Repeat BP today is better. Will continue to monitor.       Meds ordered this encounter  Medications   sertraline (ZOLOFT) 50 MG tablet    Sig: Take 1 tablet (50 mg total) by mouth daily.    Dispense:  90 tablet    Refill:  1    Order Specific Question:   Supervising Provider    Answer:  BLYTH, STACEY A [[id number]]    I, Debbrah Alar, NP, personally preformed the services described in  this documentation.  All medical record entries made by the scribe were at my direction and in my presence.  I have reviewed the chart and discharge instructions (if applicable) and agree that the record reflects my personal performance and is accurate and complete. 06/19/2021  I,Lyric Barr-McArthur,acting as a Education administrator for Nance Pear, NP.,have documented all relevant documentation on the behalf of Nance Pear, NP,as directed by  Nance Pear, NP while in the presence of Nance Pear, NP.  Nance Pear, NP

## 2021-07-27 ENCOUNTER — Ambulatory Visit: Payer: BC Managed Care – PPO | Admitting: Family Medicine

## 2021-07-27 ENCOUNTER — Encounter: Payer: Self-pay | Admitting: Family Medicine

## 2021-07-27 ENCOUNTER — Telehealth: Payer: Self-pay

## 2021-07-27 VITALS — BP 118/84 | HR 89 | Temp 97.8°F | Ht 70.0 in | Wt 172.5 lb

## 2021-07-27 DIAGNOSIS — H6591 Unspecified nonsuppurative otitis media, right ear: Secondary | ICD-10-CM

## 2021-07-27 MED ORDER — PREDNISONE 20 MG PO TABS: 40.0000 mg | ORAL_TABLET | Freq: Every day | ORAL | 0 refills | Status: AC

## 2021-07-27 NOTE — Telephone Encounter (Signed)
Nurse Assessment Nurse: Glean Salvo, RN, Magda Paganini Date/Time Eilene Ghazi Time): 07/27/2021 9:05:58 AM Confirm and document reason for call. If symptomatic, describe symptoms. ---Callers states that he has an ache where the ear runs down the neck. No fever Does the patient have any new or worsening symptoms? ---Yes Will a triage be completed? ---Yes Related visit to physician within the last 2 weeks? ---No Does the PT have any chronic conditions? (i.e. diabetes, asthma, this includes High risk factors for pregnancy, etc.) ---No Is this a behavioral health or substance abuse call? ---No Guidelines Guideline Title Affirmed Question Affirmed Notes Nurse Date/Time (Eastern Time) Earache Earache (Exceptions: brief ear pain of < 60 minutes duration, earache occurring during air travel [place], Cruzita Lederer 07/27/2021 9:07:00 AM Disp. Time Eilene Ghazi Time) Disposition Final User 07/27/2021 9:08:05 AM See PCP within 24 Hours Yes Glean Salvo, RN, Christa See Disagree/Comply Comply PLEASE NOTE: All timestamps contained within this report are represented as [place] Standard Time. CONFIDENTIALTY NOTICE: This fax transmission is intended only for the addressee. It contains information that is legally privileged, confidential or otherwise protected from use or disclosure. If you are not the intended recipient, you are strictly prohibited from reviewing, disclosing, copying using or disseminating any of this information or taking any action in reliance on or regarding this information. If you have received this fax in error, please notify us immediately by telephone so that we can arrange for its return to [place]. Phone: [phone], Toll-Free: [phone], Fax: [phone] Page: 2 of 2 Call Id: [id number] [place] Understands Yes PreDisposition Call Doctor Care Advice Given Per Guideline SEE PCP WITHIN 24 HOURS: * IF OFFICE WILL BE OPEN: You need to be examined within the next 24 hours. Call your doctor (or NP/PA)  when the office opens and make an appointment. CARE ADVICE given per Earache (Adult) guideline. * You become worse CALL BACK IF: * Severe pain persists over 2 hours after pain medicine Referrals [facility] [place]  Pt has appt with Dr. Nani Ravens

## 2021-07-27 NOTE — Patient Instructions (Signed)
It is not infected now. There is fluid.  You are safe to work out.   Try to avoid spinning.   Let me know if you have worsening pain, drainage or new symptoms.  Let us know if you need anything.

## 2021-07-27 NOTE — Progress Notes (Signed)
Chief Complaint  Patient presents with   Ear Pain    right    Pt is here for right ear pain. Duration: 2 days Progression: worse slightly  Associated symptoms: chronic allergies, no changes Denies: congestion, sneezing, and low grade fevers, bleeding, or discharge from ear Treatment to date: Q tip, tea tree oil  Past Medical History:  Diagnosis Date   Arthritis    Diverticulitis    History of Guillain-Barre syndrome    age 45   History of shingles    Pure hypercholesterolemia     BP 118/84   Pulse 89   Temp 97.8 F (36.6 C) (Oral)   Ht 5' 10"  (1.778 m)   Wt 172 lb 8 oz (78.2 kg)   SpO2 99%   BMI 24.75 kg/m  General: Awake, alert, appearing stated age HEENT:  L ear- Canal patent without drainage or erythema, TM is negative R ear- canal patent without drainage or erythema, TM is slightly bulging with serous fluid present behind it Nose- nares patent and without discharge Mouth- Lips, gums and dentition unremarkable, pharynx is without erythema or exudate Neck: No adenopathy Lungs: Normal effort, no accessory muscle use Psych: Age appropriate judgment and insight, normal mood and affect  Fluid level behind tympanic membrane of right ear - Plan: predniSONE (DELTASONE) 20 MG tablet  5-day prednisone burst 40 mg daily.  No signs of infection.  He will let me know if anything changes. F/u prn.  Pt voiced understanding and agreement to the plan.  Lewis, DO 07/27/21 4:10 PM

## 2021-09-12 ENCOUNTER — Other Ambulatory Visit: Payer: Self-pay | Admitting: Family

## 2021-12-17 ENCOUNTER — Ambulatory Visit: Payer: BC Managed Care – PPO | Admitting: Family

## 2021-12-17 VITALS — BP 135/85 | HR 82 | Temp 98.1°F | Resp 16 | Wt 176.0 lb

## 2021-12-17 DIAGNOSIS — R7303 Prediabetes: Secondary | ICD-10-CM | POA: Diagnosis not present

## 2021-12-17 DIAGNOSIS — E785 Hyperlipidemia, unspecified: Secondary | ICD-10-CM | POA: Diagnosis not present

## 2021-12-17 DIAGNOSIS — R030 Elevated blood-pressure reading, without diagnosis of hypertension: Secondary | ICD-10-CM | POA: Diagnosis not present

## 2021-12-17 DIAGNOSIS — F419 Anxiety disorder, unspecified: Secondary | ICD-10-CM

## 2021-12-17 DIAGNOSIS — Z1211 Encounter for screening for malignant neoplasm of colon: Secondary | ICD-10-CM

## 2021-12-17 LAB — LIPID PANEL
Cholesterol: 156 mg/dL (ref 0–200)
HDL: 68.1 mg/dL (ref >39.00)
LDL Cholesterol: 76 mg/dL (ref 0–99)
NonHDL: 87.54
Total CHOL/HDL Ratio: 2
Triglycerides: 58 mg/dL (ref 0.0–149.0)
VLDL: 11.6 mg/dL (ref 0.0–40.0)

## 2021-12-17 LAB — COMPREHENSIVE METABOLIC PANEL
ALT: 17 U/L (ref 0–53)
AST: 21 U/L (ref 0–37)
Albumin: 4.3 g/dL (ref 3.5–5.2)
Alkaline Phosphatase: 54 U/L (ref 39–117)
BUN: 24 mg/dL — ABNORMAL HIGH (ref 6–23)
CO2: 26 mEq/L (ref 19–32)
Calcium: 8.8 mg/dL (ref 8.4–10.5)
Chloride: 105 mEq/L (ref 96–112)
Creatinine, Ser: 1.12 mg/dL (ref 0.40–1.50)
GFR: 78.19 mL/min (ref >60.00)
Glucose, Bld: 100 mg/dL — ABNORMAL HIGH (ref 70–99)
Potassium: 4.2 mEq/L (ref 3.5–5.1)
Sodium: 139 mEq/L (ref 135–145)
Total Bilirubin: 0.5 mg/dL (ref 0.2–1.2)
Total Protein: 6.7 g/dL (ref 6.0–8.3)

## 2021-12-17 LAB — HEMOGLOBIN A1C: Hgb A1c MFr Bld: 5.8 % (ref 4.6–6.5)

## 2021-12-17 MED ORDER — SERTRALINE HCL 50 MG PO TABS: 50.0000 mg | ORAL_TABLET | Freq: Every day | ORAL | 1 refills | Status: DC

## 2021-12-17 MED ORDER — ATORVASTATIN CALCIUM 40 MG PO TABS: 40.0000 mg | ORAL_TABLET | Freq: Every day | ORAL | 1 refills | Status: DC

## 2021-12-17 NOTE — Progress Notes (Signed)
? ?Subjective:  ? ?By signing my name below, I, Joaquin Music, attest that this documentation has been prepared under the direction and in the presence of Isaiah Alar, NP 12/17/2021  ?  ? ? Patient ID: Isaiah Leonard, male    DOB: March 28, 1976, 46 y.o.   MRN: [id number] ? ?Chief Complaint  ?Patient presents with  ? Hypertension  ?  Here for follow up  ? Hyperlipidemia  ?  Here for follow up  ? ? ?HPI ?Patient is in today for an office visit and 6 month f/u. ? ?He does not have any new concerns since the last visit. ? ?Colonoscopy- He is open to getting a cologuard and will go for a colonoscopy at 74. ? ?Blood pressure- His blood pressure is stable at this visit. ?BP Readings from Last 3 Encounters:  ?12/17/21 135/85  ?07/27/21 118/84  ?06/19/21 134/70  ?  ?OCD- He is feeling good with 50 mg Zoloft. No recent panic attacks or OCD tendencies.  ? ?Immunizations- He is UTD on tetanus vaccine. He is not interested in HIV screening. ? ?Hyperlipidemia- He is managing his cholesterol levels with 40 mg Lipitor. ? ?Past Medical History:  ?Diagnosis Date  ? Arthritis   ? Diverticulitis   ? History of Guillain-Barre syndrome   ? age 45  ? History of shingles   ? Pure hypercholesterolemia   ? ? ?Past Surgical History:  ?Procedure Laterality Date  ? deviated septum repair  1991  ? SHOULDER SURGERY Left 05/2014  ? removed bone spurs  ? ? ?Family History  ?Problem Relation Age of Onset  ? Diabetes Mother   ? Hypertension Mother   ? Hyperlipidemia Mother   ? Hypertension Father   ? Hyperlipidemia Father   ? Heart attack Father   ?     MI mid 76s  ? Multiple sclerosis Sister   ? Stroke Brother   ?     history of etoh,a nd drug abuse  ? Sudden death Neg Hx   ? ? ?Social History  ? ?Socioeconomic History  ? Marital status: Married  ?  Spouse name: Not on file  ? Number of children: 1  ? Years of education: Not on file  ? Highest education level: Not on file  ?Occupational History  ?  Employer: [employer]  ?  Comment: Teacher   ?Tobacco Use  ? Smoking status: Never  ? Smokeless tobacco: Never  ?Substance and Sexual Activity  ? Alcohol use: Yes  ?  Comment: Occasional  ? Drug use: No  ? Sexual activity: Not on file  ?Other Topics Concern  ? Not on file  ?Social History Narrative  ? Married  ? [name] - born 2004  ? Never Smoked  ? Alcohol use-yes   ? 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 Caffeine use/day:  5 cups coffee daily  ? Does Patient Exercise:  occasional walks and yard work   ? Sun Exposure-Excessive:  no  ?   ?   ? ?Social Determinants of Health  ? ?Financial Resource Strain: Not on file  ?Food Insecurity: Not on file  ?Transportation Needs: Not on file  ?Physical Activity: Not on file  ?Stress: Not on file  ?Social Connections: Not on file  ?Intimate Partner Violence: Not on file  ? ? ?Outpatient Medications Prior to Visit  ?Medication Sig Dispense Refill  ? aspirin EC 81 MG tablet Take 81 mg by mouth daily.    ? atorvastatin (LIPITOR)  40 MG tablet TAKE 1 TABLET BY MOUTH EVERY DAY 90 tablet 1  ? sertraline (ZOLOFT) 50 MG tablet Take 1 tablet (50 mg total) by mouth daily. 90 tablet 1  ? ?No facility-administered medications prior to visit.  ? ? ?Allergies  ?Allergen Reactions  ? Codeine Itching  ?  hallucinations  ? Penicillins Nausea And Vomiting  ?  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 Sulfonamide Derivatives Nausea And Vomiting  ? ? ?Review of Systems  ?Psychiatric/Behavioral:  Negative for depression. The patient is not nervous/anxious.   ? ?   ?Objective:  ?  ?Physical Exam ?Constitutional:   ?   General: He is not in acute distress. ?   Appearance: Normal appearance.  ?HENT:  ?   Head: Normocephalic and atraumatic.  ?Cardiovascular:  ?   Rate and Rhythm: Normal rate and regular rhythm.  ?   Heart sounds: No murmur heard. ?Pulmonary:  ?   Effort: No respiratory distress.  ?   Breath sounds: Normal breath sounds. No wheezing or rales.  ?Skin: ?   General: Skin is warm and dry.  ?Neurological:  ?    Mental Status: He is alert and oriented to person, place, and time.  ?Psychiatric:     ?   Behavior: Behavior normal.     ?   Thought Content: Thought content normal.  ? ? ?BP 135/85 (BP Location: Right Arm, Patient Position: Sitting, Cuff Size: Small)   Pulse 82   Temp 98.1 ?F (36.7 ?C) (Oral)   Resp 16   Wt 176 lb (79.8 kg)   SpO2 99%   BMI 25.25 kg/m?  ?Wt Readings from Last 3 Encounters:  ?12/17/21 176 lb (79.8 kg)  ?07/27/21 172 lb 8 oz (78.2 kg)  ?06/19/21 174 lb (78.9 kg)  ? ? ?Diabetic Foot Exam - Simple   ?No data filed ?  ? ?Lab Results  ?Component Value Date  ? WBC 5.7 10/29/2019  ? HGB 13.5 10/29/2019  ? HCT 39.1 10/29/2019  ? PLT 240 10/29/2019  ? GLUCOSE 82 10/29/2019  ? CHOL 150 10/29/2019  ? TRIG 58 10/29/2019  ? HDL 67 10/29/2019  ? LDLDIRECT 139.3 04/11/2011  ? [place] 70 10/29/2019  ? ALT 12 10/29/2019  ? AST 16 10/29/2019  ? NA 139 10/29/2019  ? K 4.5 10/29/2019  ? CL 103 10/29/2019  ? CREATININE 0.85 10/29/2019  ? BUN 19 10/29/2019  ? CO2 25 10/29/2019  ? TSH 0.66 10/29/2019  ? INR 0.99 06/01/2014  ? HGBA1C 5.7 (H) 10/29/2019  ? ? ?Lab Results  ?Component Value Date  ? TSH 0.66 10/29/2019  ? ?Lab Results  ?Component Value Date  ? WBC 5.7 10/29/2019  ? HGB 13.5 10/29/2019  ? HCT 39.1 10/29/2019  ? MCV 86.5 10/29/2019  ? PLT 240 10/29/2019  ? ?Lab Results  ?Component Value Date  ? NA 139 10/29/2019  ? K 4.5 10/29/2019  ? CO2 25 10/29/2019  ? GLUCOSE 82 10/29/2019  ? BUN 19 10/29/2019  ? CREATININE 0.85 10/29/2019  ? BILITOT 0.6 10/29/2019  ? ALKPHOS 40 09/26/2017  ? AST 16 10/29/2019  ? ALT 12 10/29/2019  ? PROT 6.8 10/29/2019  ? ALBUMIN 4.4 09/26/2017  ? CALCIUM 9.6 10/29/2019  ? ANIONGAP 11 06/01/2014  ? GFR 115.32 08/20/2017  ? ?Lab Results  ?Component Value Date  ? CHOL 150 10/29/2019  ? ?Lab Results  ?Component Value Date  ? HDL 67 10/29/2019  ? ?Lab Results  ?  Component Value Date  ? [place] 70 10/29/2019  ? ?Lab Results  ?Component Value Date  ? TRIG 58 10/29/2019  ? ?Lab Results   ?Component Value Date  ? CHOLHDL 2.2 10/29/2019  ? ?Lab Results  ?Component Value Date  ? HGBA1C 5.7 (H) 10/29/2019  ? ? ?   ?Assessment & Plan:  ? ?Problem List Items Addressed This Visit   ? ?  ? Unprioritized  ? Hyperlipidemia  ?  Lab Results  ?Component Value Date  ? CHOL 150 10/29/2019  ? HDL 67 10/29/2019  ? [place] 70 10/29/2019  ? LDLDIRECT 139.3 04/11/2011  ? TRIG 58 10/29/2019  ? CHOLHDL 2.2 10/29/2019  ?Tolerating atorvastatin. Continue same, obtain follow up lipid panel.  ?  ?  ? Relevant Medications  ? atorvastatin (LIPITOR) 40 MG tablet  ? Other Relevant Orders  ? Lipid panel  ? Comp Met (CMET)  ? Elevated blood pressure reading  ?  BP Readings from Last 3 Encounters:  ?12/17/21 135/85  ?07/27/21 118/84  ?06/19/21 134/70  ?Looks OK today. Will continue to monitor.  ?  ?  ? Borderline diabetes  ?  Lab Results  ?Component Value Date  ? HGBA1C 5.7 (H) 10/29/2019  ? ?  ?  ? Relevant Orders  ? Hemoglobin A1c  ? Anxiety  ?  Stable on sertraline, continue same.  ? ?  ?  ? Relevant Medications  ? sertraline (ZOLOFT) 50 MG tablet  ? ?Other Visit Diagnoses   ? ? Colon cancer screening    -  Primary  ? Relevant Orders  ? Cologuard  ? ?  ? ? ?Meds ordered this encounter  ?Medications  ? atorvastatin (LIPITOR) 40 MG tablet  ?  Sig: Take 1 tablet (40 mg total) by mouth daily.  ?  Dispense:  90 tablet  ?  Refill:  1  ?  Order Specific Question:   Supervising Provider  ?  Answer:   Penni Homans A [[id number]]  ? sertraline (ZOLOFT) 50 MG tablet  ?  Sig: Take 1 tablet (50 mg total) by mouth daily.  ?  Dispense:  90 tablet  ?  Refill:  1  ?  Order Specific Question:   Supervising Provider  ?  Answer:   Penni Homans A [[id number]]  ? ? ?I,Zite Okoli,acting as a Education administrator for [employer], NP.,have documented all relevant documentation on the behalf of Isaiah Pear, NP,as directed by  Isaiah Pear, NP while in the presence of Isaiah Pear, NP.  ? ?I, Isaiah Alar, NP, personally preformed the  services described in this documentation.  All medical record entries made by the scribe were at my direction and in my presence.  I have reviewed the chart and discharge instructions (if applicable) and agree that the

## 2021-12-17 NOTE — Assessment & Plan Note (Signed)
Lab Results  ?Component Value Date  ? HGBA1C 5.7 (H) 10/29/2019  ? ? ?

## 2021-12-17 NOTE — Assessment & Plan Note (Signed)
Stable on sertraline, continue same.  ?

## 2021-12-17 NOTE — Assessment & Plan Note (Addendum)
BP Readings from Last 3 Encounters:  ?12/17/21 135/85  ?07/27/21 118/84  ?06/19/21 134/70  ? ?Looks OK today. Will continue to monitor.  ?

## 2021-12-17 NOTE — Assessment & Plan Note (Addendum)
Lab Results  ?Component Value Date  ? CHOL 150 10/29/2019  ? HDL 67 10/29/2019  ? [place] 70 10/29/2019  ? LDLDIRECT 139.3 04/11/2011  ? TRIG 58 10/29/2019  ? CHOLHDL 2.2 10/29/2019  ? ?Tolerating atorvastatin. Continue same, obtain follow up lipid panel.  ?

## 2021-12-23 LAB — COLOGUARD: Cologuard: NEGATIVE

## 2022-01-01 LAB — COLOGUARD: COLOGUARD: NEGATIVE

## 2022-01-01 NOTE — Progress Notes (Signed)
Cologuard abstracted  ?

## 2022-02-18 ENCOUNTER — Encounter: Payer: Self-pay | Admitting: Family Medicine

## 2022-02-18 ENCOUNTER — Telehealth: Payer: Self-pay

## 2022-02-18 ENCOUNTER — Ambulatory Visit (INDEPENDENT_AMBULATORY_CARE_PROVIDER_SITE_OTHER): Payer: BC Managed Care – PPO | Admitting: Family Medicine

## 2022-02-18 VITALS — BP 126/79 | HR 69 | Ht 70.0 in | Wt 172.0 lb

## 2022-02-18 DIAGNOSIS — R197 Diarrhea, unspecified: Secondary | ICD-10-CM

## 2022-02-18 DIAGNOSIS — R1084 Generalized abdominal pain: Secondary | ICD-10-CM

## 2022-02-18 LAB — CBC
HCT: 40.3 % (ref 39.0–52.0)
Hemoglobin: 13.8 g/dL (ref 13.0–17.0)
MCHC: 34.2 g/dL (ref 30.0–36.0)
MCV: 86.3 fl (ref 78.0–100.0)
Platelets: 221 10*3/uL (ref 150.0–400.0)
RBC: 4.67 Mil/uL (ref 4.22–5.81)
RDW: 13.4 % (ref 11.5–15.5)
WBC: 5.5 10*3/uL (ref 4.0–10.5)

## 2022-02-18 LAB — COMPREHENSIVE METABOLIC PANEL
ALT: 17 U/L (ref 0–53)
AST: 18 U/L (ref 0–37)
Albumin: 4.4 g/dL (ref 3.5–5.2)
Alkaline Phosphatase: 58 U/L (ref 39–117)
BUN: 15 mg/dL (ref 6–23)
CO2: 26 mEq/L (ref 19–32)
Calcium: 9.4 mg/dL (ref 8.4–10.5)
Chloride: 102 mEq/L (ref 96–112)
Creatinine, Ser: 1.04 mg/dL (ref 0.40–1.50)
GFR: 85.36 mL/min (ref >60.00)
Glucose, Bld: 99 mg/dL (ref 70–99)
Potassium: 3.7 mEq/L (ref 3.5–5.1)
Sodium: 137 mEq/L (ref 135–145)
Total Bilirubin: 0.5 mg/dL (ref 0.2–1.2)
Total Protein: 7.3 g/dL (ref 6.0–8.3)

## 2022-02-18 LAB — LIPASE: Lipase: 20 U/L (ref 11.0–59.0)
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
## 2022-02-18 NOTE — Telephone Encounter (Signed)
Nurse Assessment Nurse: Vedia Coffer, RN, Morrie Sheldon Date/Time (Eastern Time): 02/18/2022 7:20:57 AM Confirm and document reason for call. If symptomatic, describe symptoms. ---Caller states he is calling to make an appointment. He has lower right stomach pain and diarrhea. Symptoms started Friday with the diarrhea, pain started last night. Pain is 2/10 rightnow. Does the patient have any new or worsening symptoms? ---Yes Will a triage be completed? ---Yes Related visit to physician within the last 2 weeks? ---No Does the PT have any chronic conditions? (i.e. diabetes, asthma, this includes High risk factors for pregnancy, etc.) ---No Is this a behavioral health or substance abuse call? ---No Guidelines Guideline Title Affirmed Question Affirmed Notes Nurse Date/Time Lamount Cohen Time) Abdominal Pain - Male [1] MILDMODERATE pain AND [2] constant AND [3] present > 2 hours Black, RN, Morrie Sheldon 02/18/2022 7:22:49 AM Disp. Time Lamount Cohen Time) Disposition Final User 02/18/2022 7:25:25 AM See HCP within 4 Hours (or PCP triage) Yes Black, RN, Morrie Sheldon PLEASE NOTE: All timestamps contained within this report are represented as [place] Standard Time. CONFIDENTIALTY NOTICE: This fax transmission is intended only for the addressee. It contains information that is legally privileged, confidential or otherwise protected from use or disclosure. If you are not the intended recipient, you are strictly prohibited from reviewing, disclosing, copying using or disseminating any of this information or taking any action in reliance on or regarding this information. If you have received this fax in error, please notify us immediately by telephone so that we can arrange for its return to [place]. Phone: [phone], Toll-Free: [phone], Fax: [phone] Page: 2 of 2 Call Id: [id number] Final Disposition 02/18/2022 7:25:25 AM See HCP within 4 Hours (or PCP triage) Yes Black, RN, Merlinda Frederick Disagree/Comply Comply Caller  Understands Yes PreDisposition InappropriateToAsk Care Advice Given Per Guideline SEE HCP (OR PCP TRIAGE) WITHIN 4 HOURS: * IF OFFICE WILL BE OPEN: You need to be seen within the next 3 or 4 hours. Call your doctor (or NP/PA) now or as soon as the office opens. CALL BACK IF: * You become worse CARE ADVICE given per Abdominal Pain - Male (Adult) guideline. Referrals REFERRED TO PCP OFFICE

## 2022-02-18 NOTE — Patient Instructions (Signed)
No alarm findings on exam and reassuring that your symptoms are improving. We can start with labs to ensure nothing else is going on before we are closed for the holiday. If you develop any new or worsening symptoms, seek care. I would recommend another day or two of gentle/bland diet and change to clear liquids if symptoms begin worsening.

## 2022-02-18 NOTE — Progress Notes (Signed)
Diarrhea started Friday Continued into sat with some more formed stools RLQ pain last night

## 2022-02-18 NOTE — Progress Notes (Signed)
   Acute Office Visit  Subjective:     Patient ID: Isaiah Leonard, male    DOB: 05-Mar-1976, 46 y.o.   MRN: [id number]  CC: diarrhea   HPI Patient is in today for diarrhea and abdominal discomfort.   Patient reports that on Friday, 3 days ago he started having diarrhea. By Saturday he was still having some diarrhea, but started having some formed stools as well. He felt decent on Sunday, but when lying down that night he noticed some vague RLQ discomfort and within a few hours he woke up with more diarrhea, but his then his abdominal discomfort felt better. States Today his stools are more formed and he has not had any more diarrhea or pain. He denies any fevers, chills, body aches, nausea, vomiting, blood in stool, urinary change, dietary changes, sick contacts. He was in [place] the week prior, but had been feeling well all week since being back home.   Reports history of diverticulitis, but pain at that time was left sided only.   ROS All review of systems negative except what is listed in the HPI      Objective:    BP 126/79   Pulse 69   Ht 5\' 10"  (1.778 m)   Wt 172 lb (78 kg)   BMI 24.68 kg/m    Physical Exam Vitals reviewed.  Constitutional:      Appearance: Normal appearance.  Pulmonary:     Effort: Pulmonary effort is normal.     Breath sounds: Normal breath sounds.  Abdominal:     General: Abdomen is flat. Bowel sounds are normal. There is no distension.     Palpations: Abdomen is soft. There is no mass.     Tenderness: There is no abdominal tenderness. There is no guarding or rebound.  Neurological:     General: No focal deficit present.     Mental Status: He is alert and oriented to person, place, and time. Mental status is at baseline.  Psychiatric:        Mood and Affect: Mood normal.        Behavior: Behavior normal.        Thought Content: Thought content normal.        Judgment: Judgment normal.           Assessment & Plan:   1. Diarrhea,  unspecified type 2. Abdominal discomfort, generalized Symptoms mostly resolved, but he wanted to be cautious before we are closed for the holiday. No red flags on exam.   We can start with labs to ensure nothing else is going on before we are closed for the holiday. If you develop any new or worsening symptoms, seek care. I would recommend another day or two of gentle/bland diet and change to clear liquids if symptoms begin worsening.   Please contact office for follow-up if symptoms do not improve or worsen. Seek emergency care if symptoms become severe.   - CBC - Comprehensive metabolic panel - Lipase   Return if symptoms worsen or fail to improve.  , NP

## 2022-02-18 NOTE — Telephone Encounter (Signed)
Appt scheduled

## 2022-03-19 ENCOUNTER — Ambulatory Visit (INDEPENDENT_AMBULATORY_CARE_PROVIDER_SITE_OTHER): Payer: BC Managed Care – PPO | Admitting: Family

## 2022-03-19 ENCOUNTER — Encounter: Payer: Self-pay | Admitting: Family

## 2022-03-19 VITALS — BP 118/88 | HR 77 | Temp 98.3°F | Resp 16 | Wt 176.0 lb

## 2022-03-19 DIAGNOSIS — R030 Elevated blood-pressure reading, without diagnosis of hypertension: Secondary | ICD-10-CM

## 2022-03-19 DIAGNOSIS — F429 Obsessive-compulsive disorder, unspecified: Secondary | ICD-10-CM | POA: Diagnosis not present

## 2022-03-19 DIAGNOSIS — R7303 Prediabetes: Secondary | ICD-10-CM | POA: Diagnosis not present

## 2022-03-19 DIAGNOSIS — E785 Hyperlipidemia, unspecified: Secondary | ICD-10-CM | POA: Diagnosis not present

## 2022-03-19 DIAGNOSIS — Z0000 Encounter for general adult medical examination without abnormal findings: Secondary | ICD-10-CM

## 2022-03-19 DIAGNOSIS — Z23 Encounter for immunization: Secondary | ICD-10-CM | POA: Diagnosis not present

## 2022-03-19 NOTE — Progress Notes (Signed)
Subjective:   By signing my name below, I, Cassell Clement, attest that this documentation has been prepared under the direction and in the presence of Birdie Sons, NP 03/19/2022   Patient ID: Isaiah Leonard, male    DOB: 1976/01/03, 46 y.o.   MRN: [id number]  Chief Complaint  Patient presents with   Annual Exam         HPI Patient is in today for a comprehensive physical exam  Blood Pressure: As of today's visit, his blood pressure is normal and his reading is 118/88 BP Readings from Last 3 Encounters:  03/19/22 118/88  02/18/22 126/79  12/17/21 135/85   BP Readings from Last 3 Encounters:  03/19/22 118/88  02/18/22 126/79  12/17/21 135/85   OCD: He is currently taking 50 Mg of Zoloft and reports stable symptoms Cholesterol: His cholesterol levels are good. He is currently taking 40 Mg of Lipitor and reports no issues Lab Results  Component Value Date   CHOL 156 12/17/2021   HDL 68.10 12/17/2021   LDLCALC 76 12/17/2021   LDLDIRECT 139.3 04/11/2011   TRIG 58.0 12/17/2021   CHOLHDL 2 12/17/2021   A1C: His A1C levels are stable  Lab Results  Component Value Date   HGBA1C 5.8 12/17/2021   Kidney: His kidney function is fine Lab Results  Component Value Date   CREATININE 1.04 02/18/2022   BUN 15 02/18/2022   NA 137 02/18/2022   K 3.7 02/18/2022   CL 102 02/18/2022   CO2 26 02/18/2022   Liver:His liver functions are normal Lab Results  Component Value Date   ALT 17 02/18/2022   AST 18 02/18/2022   ALKPHOS 58 02/18/2022   BILITOT 0.5 02/18/2022   Allergies: He reports of a stuffy nose.  He denies having any fever, new muscle pain, joint pain , new moles, congestion, sinus pain, sore throat, palpations, cough, SOB ,wheezing,n/v/d constipation, blood in stool, dysuria, frequency, hematuria, depression, anxiety, headaches at this time  Social history: He reports no recent surgeries. He denies of any changes to his family medical  history Immunizations: He is due for a tetanus vaccine and is interested in receiving the vaccine today. Diet: He is trying to maintain a healthy diet. He states that he mostly eats chicken and fish and has cut down on red meat.  Exercise: He exercises about 6 times a week.  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13   History of shingles    Pure hypercholesterolemia     Past Surgical History:  Procedure Laterality Date   deviated septum repair  1991   SHOULDER SURGERY Left 05/2014   removed bone spurs    Family History  Problem Relation Age of Onset   Diabetes Mother    Hypertension Mother    Hyperlipidemia Mother    Hypertension Father    Hyperlipidemia Father    Heart attack Father        MI mid 53s   Multiple sclerosis Sister    Stroke Brother        history of etoh,a nd drug abuse   Sudden death Neg  Hx     Social History   Socioeconomic History   Marital status: Married    Spouse name: Not on file   Number of children: 1   Years of education: Not on file   Highest education level: Not on file  Occupational History    Employer: [employer]    Comment: Teacher  Tobacco Use   Smoking status: Never   Smokeless tobacco: Never  Substance and Sexual Activity   Alcohol use: Yes    Comment: Occasional   Drug use: No   Sexual activity: Not on file  Other Topics Concern   Not on file  Social History Narrative   Married   [place] - born 2004   Never Smoked   Alcohol use-yes    Occupation:  History Runner, 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     atorvastatin (LIPITOR) 40 MG tablet Take 1 tablet (40 mg total) by mouth daily. 90 tablet 1   sertraline (ZOLOFT) 50 MG tablet Take 1 tablet (50 mg total) by mouth daily. 90 tablet 1   No facility-administered medications prior to visit.    Allergies  Allergen Reactions   Codeine Itching    hallucinations   Penicillins Nausea And Vomiting    Told not to take due to Guillain Barre' Syndrome   Sulfonamide Derivatives Nausea And Vomiting    Review of Systems  Constitutional:  Negative for fever.  HENT:  Negative for congestion, sinus pain and sore throat.        (+) Stuffy Nose  Respiratory:  Negative for cough, shortness of breath and wheezing.   Cardiovascular:  Negative for chest pain and palpitations.  Gastrointestinal:  Negative for blood in stool, constipation, diarrhea, nausea and vomiting.  Genitourinary:  Negative for dysuria, frequency and hematuria.  Musculoskeletal:  Negative for joint pain and myalgias.  Skin:        (-) New Moles  Neurological:  Negative for headaches.  Psychiatric/Behavioral:  Negative for depression. The patient is not nervous/anxious.        Objective:    Physical Exam Constitutional:      General: He is not in acute distress.    Appearance: Normal appearance. He is not ill-appearing.  HENT:     Head: Normocephalic and atraumatic.     Right Ear: Tympanic membrane, ear canal and external ear normal.     Left Ear: Tympanic membrane, ear canal and external ear normal.     Mouth/Throat:     Pharynx: No oropharyngeal exudate.  Eyes:      Extraocular Movements: Extraocular movements intact.     Pupils: Pupils are equal, round, and reactive to light.  Neck:     Thyroid: No thyromegaly.  Cardiovascular:     Rate and Rhythm: Normal rate and regular rhythm.     Heart sounds: Normal heart sounds. No murmur  heard.    No gallop.  Pulmonary:     Effort: Pulmonary effort is normal. No respiratory distress.     Breath sounds: Normal breath sounds. No wheezing or rales.  Abdominal:     General: Bowel sounds are normal. There is no distension.     Palpations: Abdomen is soft.     Tenderness: There is no abdominal tenderness. There is no guarding.  Musculoskeletal:     Comments: 5/5 strength in both upper and lower extremities    Lymphadenopathy:     Cervical: No cervical adenopathy.  Skin:    General: Skin is warm and dry.  Neurological:     Mental Status: He is alert and oriented to person, place, and time.     Deep Tendon Reflexes:     Reflex Scores:      Patellar reflexes are 2+ on the right side and 2+ on the left side. Psychiatric:        Mood and Affect: Mood normal.        Behavior: Behavior normal.        Judgment: Judgment normal.     BP 118/88   Pulse 77   Temp 98.3 F (36.8 C) (Oral)   Resp 16   Wt 176 lb (79.8 kg)   SpO2 99%   BMI 25.25 kg/m  Wt Readings from Last 3 Encounters:  03/19/22 176 lb (79.8 kg)  02/18/22 172 lb (78 kg)  12/17/21 176 lb (79.8 kg)       Assessment & Plan:   Problem List Items Addressed This Visit       Unprioritized   Preventative health care - Primary    Continue healthy diet and regular exercise. Cologuard up to date. Tdap today. Recommended Covid Booster in September when available.      OCD (obsessive compulsive disorder)    Stable on sertraline. Continue same.       Hyperlipidemia    Tolerating atorvastatin. Lipids at goal.  Lab Results  Component Value Date   CHOL 156 12/17/2021   HDL 68.10 12/17/2021   LDLCALC 76 12/17/2021   LDLDIRECT 139.3  04/11/2011   TRIG 58.0 12/17/2021   CHOLHDL 2 12/17/2021        Elevated blood pressure reading    Initial bp elevated but follow up BP looks good. Continue to monitor.       Borderline diabetes    Lab Results  Component Value Date   HGBA1C 5.8 12/17/2021  A1C stable.  He continues to work on his diet and exercises 6 days a week.         No orders of the defined types were placed in this encounter.   I, Nance Pear, NP, personally preformed the services described in this documentation.  All medical record entries made by the scribe were at my direction and in my presence.  I have reviewed the chart and discharge instructions (if applicable) and agree that the record reflects my personal performance and is accurate and complete. 03/19/2022   I,Amber Collins,acting as a scribe for Nance Pear, NP.,have documented all relevant documentation on the behalf of Nance Pear, NP,as directed by  Nance Pear, NP while in the presence of Nance Pear, NP.    Nance Pear, NP

## 2022-03-19 NOTE — Assessment & Plan Note (Signed)
Initial bp elevated but follow up BP looks good. Continue to monitor.

## 2022-03-19 NOTE — Assessment & Plan Note (Addendum)
Continue healthy diet and regular exercise. Cologuard up to date. Tdap today. Recommended Covid Booster in September when available.

## 2022-03-19 NOTE — Assessment & Plan Note (Signed)
Stable on sertraline Continue same.  

## 2022-03-19 NOTE — Assessment & Plan Note (Signed)
Lab Results  Component Value Date   HGBA1C 5.8 12/17/2021   A1C stable.  He continues to work on his diet and exercises 6 days a week.

## 2022-03-19 NOTE — Assessment & Plan Note (Signed)
Tolerating atorvastatin. Lipids at goal.  Lab Results  Component Value Date   CHOL 156 12/17/2021   HDL 68.10 12/17/2021   LDLCALC 76 12/17/2021   LDLDIRECT 139.3 04/11/2011   TRIG 58.0 12/17/2021   CHOLHDL 2 12/17/2021

## 2022-05-06 ENCOUNTER — Ambulatory Visit (INDEPENDENT_AMBULATORY_CARE_PROVIDER_SITE_OTHER): Payer: BC Managed Care – PPO | Admitting: Family

## 2022-05-06 VITALS — BP 147/89 | HR 84 | Temp 97.7°F | Resp 16 | Wt 177.0 lb

## 2022-05-06 DIAGNOSIS — B070 Plantar wart: Secondary | ICD-10-CM | POA: Diagnosis not present

## 2022-05-06 NOTE — Progress Notes (Addendum)
Subjective:   By signing my name below, I, Isaiah Leonard, attest that this documentation has been prepared under the direction and in the presence of Isaiah Chimera, NP 05/06/2022    Patient ID: Isaiah Leonard, male    DOB: [birth year], 46 y.o.   MRN: [id number]  Chief Complaint  Patient presents with   Warts    Complains of wart on bottom of left foot    HPI Patient is in today for an office visit  Wart: He complains of a wart at the bottom of his left foot. Health Maintenance Due  Topic Date Due   COVID-19 Vaccine (4 - Pfizer series) 10/09/2020   INFLUENZA VACCINE  03/19/2022    Past Medical History:  Diagnosis Date   Arthritis    Diverticulitis    History of Guillain-Barre syndrome    age 47   History of shingles    Pure hypercholesterolemia     Past Surgical History:  Procedure Laterality Date   deviated septum repair  1991   SHOULDER SURGERY Left 05/2014   removed bone spurs    Family History  Problem Relation Age of Onset   Diabetes Mother    Hypertension Mother    Hyperlipidemia Mother    Hypertension Father    Hyperlipidemia Father    Heart attack Father        MI mid 67s   Multiple sclerosis Sister    Stroke Brother        history of etoh,a nd drug abuse   Sudden death Neg Hx     Social History   Socioeconomic History   Marital status: Married    Spouse name: Not on file   Number of children: 1   Years of education: Not on file   Highest education level: Not on file  Occupational History    Employer: [employer]    Comment: Teacher  Tobacco Use   Smoking status: Never   Smokeless tobacco: Never  Substance and Sexual Activity   Alcohol use: Yes    Comment: Occasional   Drug use: No   Sexual activity: Not on file  Other Topics Concern   Not on file  Social History Narrative   Married   [name] - born 2004   Never Smoked   Alcohol use-yes    Occupation:  History Pharmacist, 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     atorvastatin (LIPITOR) 40 MG tablet Take 1 tablet (40 mg total) by mouth daily. 90 tablet 1   sertraline (ZOLOFT) 50 MG tablet Take 1 tablet (50 mg total) by mouth daily. 90 tablet 1   No facility-administered medications prior to visit.    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    Appearance: Normal appearance. He is not ill-appearing.  HENT:     Head: Normocephalic and atraumatic.     Right Ear: External ear normal.     Left Ear: External ear normal.  Eyes:     Extraocular Movements: Extraocular movements intact.     Pupils: Pupils are equal, round, and reactive to light.  Cardiovascular:     Rate and Rhythm: Normal rate.  Pulmonary:     Effort: Pulmonary effort is normal.  Skin:    General: Skin is warm and dry.          Comments: Small plantars wart left foot beneath base of great toe  Neurological:     Mental Status: He is alert and oriented to person, place, and time.  Psychiatric:        Mood and Affect: Mood normal.        Behavior: Behavior normal.        Judgment: Judgment normal.     BP (!) 147/89   Pulse 84   Temp 97.7 F (36.5  C) (Oral)   Resp 16   Wt 177 lb (80.3 kg)   SpO2 99%   BMI 25.40 kg/m  Wt Readings from Last 3 Encounters:  05/06/22 177 lb (80.3 kg)  03/19/22 176 lb (79.8 kg)  02/18/22 172 lb (78 kg)       Assessment & Plan:   Problem List Items Addressed This Visit       Unprioritized   Plantar wart of left foot - Primary    New.  After verbal consent wart was frozen x 3 using liquid nitrogen.  Pt tolerated procedure well.  Recommended that he apply compound W to wart once daily and cover with duct tape. Follow up in 3-4 weeks for re-treatment if wart persists.  Tylenol as needed for pain today.       No orders of the defined types were placed in this encounter.   I, Lemont Fillers, NP, personally preformed the services described in this documentation.  All medical record entries made by the scribe were at my direction and in my presence.  I have reviewed the chart and discharge instructions (if applicable) and agree that the record reflects my personal performance and is accurate and complete. 05/06/2022   I,Amber Collins,acting as a Neurosurgeon for Lemont Fillers, NP.,have documented all relevant documentation on the behalf of Lemont Fillers, NP,as directed by  Lemont Fillers, NP while in the presence of Lemont Fillers, NP.    Lemont Fillers, NP

## 2022-05-06 NOTE — Assessment & Plan Note (Signed)
New.  After verbal consent wart was frozen x 3 using liquid nitrogen.  Pt tolerated procedure well.  Recommended that he apply compound W to wart once daily and cover with duct tape. Follow up in 3-4 weeks for re-treatment if wart persists.  Tylenol as needed for pain today.

## 2022-06-05 ENCOUNTER — Ambulatory Visit: Payer: BC Managed Care – PPO | Admitting: Family

## 2022-06-12 ENCOUNTER — Ambulatory Visit (INDEPENDENT_AMBULATORY_CARE_PROVIDER_SITE_OTHER): Payer: BC Managed Care – PPO | Admitting: Family

## 2022-06-12 DIAGNOSIS — B070 Plantar wart: Secondary | ICD-10-CM

## 2022-06-12 NOTE — Assessment & Plan Note (Signed)
After verbal consent was obtained wart was debrided with dermal curette and then frozen using liquid nitrogen x 3.  He will follow up as needed for retreatment if not resolved in 1 month.

## 2022-06-12 NOTE — Progress Notes (Signed)
Subjective:   By signing my name below, I, Shehryar Baig, attest that this documentation has been prepared under the direction and in the presence of Debbrah Alar, NP. 06/12/2022    Patient ID: Isaiah Leonard, male    DOB: June 13, 1976, 46 y.o.   MRN: [id number]  Chief Complaint  Patient presents with   Plantar Warts    Here for follow up    HPI Patient is in today for a follow up visit.   Wart on foot- He continues complaining of a wart on bottom his left foot. He had no pain or issues following his last liquid nitrogen. He feels that the wart has gotten a bit smaller since last visit.    Health Maintenance Due  Topic Date Due   COVID-19 Vaccine (4 - Pfizer series) 10/09/2020   INFLUENZA VACCINE  03/19/2022    Past Medical History:  Diagnosis Date   Arthritis    Diverticulitis    History of Guillain-Barre syndrome    age 8   History of shingles    Pure hypercholesterolemia     Past Surgical History:  Procedure Laterality Date   deviated septum repair  1991   SHOULDER SURGERY Left 05/2014   removed bone spurs    Family History  Problem Relation Age of Onset   Diabetes Mother    Hypertension Mother    Hyperlipidemia Mother    Hypertension Father    Hyperlipidemia Father    Heart attack Father        MI mid 86s   Multiple sclerosis Sister    Stroke Brother        history of etoh,a nd drug abuse   Sudden death Neg Hx     Social History   Socioeconomic History   Marital status: Married    Spouse name: Not on file   Number of children: 1   Years of education: Not on file   Highest education level: Not on file  Occupational History    Employer: [employer]    Comment: Teacher  Tobacco Use   Smoking status: Never   Smokeless tobacco: Never  Substance and Sexual Activity   Alcohol use: Yes    Comment: Occasional   Drug use: No   Sexual activity: Not on file  Other Topics Concern   Not on file  Social History Narrative   Married    [place] - born 2004   Never Smoked   Alcohol use-yes    Occupation:  History Pharmacist, 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     atorvastatin (LIPITOR) 40 MG tablet Take 1 tablet (40 mg total) by mouth daily. 90 tablet 1   sertraline (ZOLOFT) 50 MG tablet Take 1 tablet (50 mg total) by mouth daily. 90 tablet 1   No facility-administered medications prior to visit.    Allergies  Allergen Reactions  Codeine Itching    hallucinations   Penicillins Nausea And Vomiting    Told not to take due to Guillain Barre' Syndrome   Sulfonamide Derivatives Nausea And Vomiting    Review of Systems  Skin:        (+)plantar wart of left foot       Objective:    Physical Exam Constitutional:      General: He is not in acute distress.    Appearance: Normal appearance. He is not ill-appearing.  HENT:     Head: Normocephalic and atraumatic.     Right Ear: External ear normal.     Left Ear: External ear normal.  Eyes:     Extraocular Movements: Extraocular movements intact.     Pupils: Pupils are equal, round, and reactive to light.  Cardiovascular:     Heart sounds:     No gallop.  Pulmonary:     Effort: Pulmonary effort is normal.  Skin:    General: Skin is warm and dry.     Comments: Small wart beneath left great toe   Neurological:     Mental Status: He is alert and oriented to person, place, and time.  Psychiatric:        Judgment: Judgment normal.     BP 130/82 (BP  Location: Right Arm, Patient Position: Sitting, Cuff Size: Small)   Pulse 78   Temp 97.7 F (36.5 C) (Oral)   Resp 16   Wt 172 lb (78 kg)   SpO2 100%   BMI 24.68 kg/m  Wt Readings from Last 3 Encounters:  06/12/22 172 lb (78 kg)  05/06/22 177 lb (80.3 kg)  03/19/22 176 lb (79.8 kg)       Assessment & Plan:   Problem List Items Addressed This Visit       Unprioritized   Plantar wart of left foot    After verbal consent was obtained wart was debrided with dermal curette and then frozen using liquid nitrogen x 3.  He will follow up as needed for retreatment if not resolved in 1 month.         No orders of the defined types were placed in this encounter.   I, Nance Pear, NP, personally preformed the services described in this documentation.  All medical record entries made by the scribe were at my direction and in my presence.  I have reviewed the chart and discharge instructions (if applicable) and agree that the record reflects my personal performance and is accurate and complete. 06/12/2022   I,Shehryar Baig,acting as a Education administrator for Nance Pear, NP.,have documented all relevant documentation on the behalf of Nance Pear, NP,as directed by  Nance Pear, NP while in the presence of Nance Pear, NP.   Nance Pear, NP

## 2022-07-31 ENCOUNTER — Encounter (HOSPITAL_BASED_OUTPATIENT_CLINIC_OR_DEPARTMENT_OTHER): Payer: Self-pay | Admitting: Family Medicine

## 2022-07-31 ENCOUNTER — Ambulatory Visit (HOSPITAL_BASED_OUTPATIENT_CLINIC_OR_DEPARTMENT_OTHER): Payer: BC Managed Care – PPO | Admitting: Family Medicine

## 2022-07-31 VITALS — BP 130/92 | HR 71 | Ht 68.0 in | Wt 174.9 lb

## 2022-07-31 DIAGNOSIS — R030 Elevated blood-pressure reading, without diagnosis of hypertension: Secondary | ICD-10-CM | POA: Diagnosis not present

## 2022-07-31 DIAGNOSIS — B070 Plantar wart: Secondary | ICD-10-CM

## 2022-07-31 DIAGNOSIS — F429 Obsessive-compulsive disorder, unspecified: Secondary | ICD-10-CM

## 2022-07-31 MED ORDER — SALICYLIC ACID 27.5 % EX LIQD: 1.0000 | Freq: Every day | CUTANEOUS | 1 refills | Status: AC

## 2022-07-31 NOTE — Assessment & Plan Note (Signed)
Discussed options today.  Unfortunately, we do not have cryotherapy available here in the office.  Discussed alternative topical treatments, specifically discussed topical salicylic acid treatment.  Patient would be amenable to this alternative to cryotherapy.  We discussed appropriate application of the medication.  We will plan for follow-up in about 2 months to assess progress with this topical treatment If not responding as expected, can also consider evaluation and treatment with podiatry

## 2022-07-31 NOTE — Progress Notes (Signed)
New Patient Office Visit  Subjective    Patient ID: Isaiah Leonard, male    DOB: 1976/05/20  Age: 46 y.o. MRN: [id number]  CC:  Chief Complaint  Patient presents with   New Patient (Initial Visit)    Pt here to establish new care     HPI Isaiah Leonard presents to establish care Last PCP - [place] [facility]  HLD: Has been managed with atorvastatin 40 mg. Has been taking for about 25 years now.  Denies any issues with medication, no reported myalgia  Elevated BP without diagnosis of HTN: Has been borderline elevated in the office in the past.  He has not had any symptoms such as chest pain or headaches.  Has not required medication previously  OCD: Diagnosed in the recent past and has been taking sertraline for management. Has been taking this for about 2 years; previously was taking Lexapro. Does feel that symptoms are well-controlled with current regimen.  Plantar wart: This was being treated by most recent primary care visit.  They were treating with cryotherapy in the office.  He has had a few treatments thus far.  Requesting further treatment of this today  Patient is originally from [place], [place]. Has lived here since 2006. Patient works in Programme researcher, broadcasting/film/video. Outside of work, he enjoys Nurse, mental health - he goes to [employer].  Outpatient Encounter Medications as of 07/31/2022  Medication Sig   aspirin EC 81 MG tablet Take 81 mg by mouth daily.   atorvastatin (LIPITOR) 40 MG tablet Take 1 tablet (40 mg total) by mouth daily.   Salicylic Acid 27.5 % LIQD Apply 1 Application topically daily.   sertraline (ZOLOFT) 50 MG tablet Take 1 tablet (50 mg total) by mouth daily.   No facility-administered encounter medications on file as of 07/31/2022.    Past Medical History:  Diagnosis Date   Arthritis    Diverticulitis    History of Guillain-Barre syndrome    age 36   History of shingles    Pure hypercholesterolemia     Past Surgical History:  Procedure Laterality  Date   deviated septum repair  1991   SHOULDER SURGERY Left 05/2014   removed bone spurs    Family History  Problem Relation Age of Onset   Diabetes Mother    Hypertension Mother    Hyperlipidemia Mother    Hypertension Father    Hyperlipidemia Father    Heart attack Father        MI mid 15s   Multiple sclerosis Sister    Stroke Brother        history of etoh,a nd drug abuse   Sudden death Neg Hx     Social History   Socioeconomic History   Marital status: Married    Spouse name: Not on file   Number of children: 1   Years of education: Not on file   Highest education level: Not on file  Occupational History    Employer: [employer]    Comment: Teacher  Tobacco Use   Smoking status: Never   Smokeless tobacco: Never  Substance and Sexual Activity   Alcohol use: Yes    Comment: Occasional   Drug use: No   Sexual activity: Not on file  Other Topics Concern   Not on file  Social History Narrative   Married   [place] - born 2004   Never Smoked   Alcohol use-yes    Occupation:  History Runner, 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    Caffeine use/day:  5 cups coffee daily   Does Patient Exercise:  occasional walks and yard work    Designer, 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-Excessive:  no         Social Determinants of Corporate investment banker Strain: Not on file  Food Insecurity: Not on file  Transportation Needs: Not on file  Physical Activity: Not on file  Stress: Not on file  Social Connections: Not on file  Intimate Partner Violence: Not on file    Objective    BP (!) 130/92 (BP Location: Left Arm, Patient Position: Sitting, Cuff Size: Large)   Pulse 71   Ht 5\' 8"  (1.727 m)   Wt 174 lb 14.4 oz (79.3 kg)   SpO2 100%   BMI 26.59 kg/m   Physical Exam  46 year old male in no acute distress Cardiovascular exam with regular rate and rhythm, no murmur appreciated Lungs clear to auscultation bilaterally On left foot near plantar surface of MTP joint medially, small, raised, hyperkeratotic  area present.  No surrounding erythema, no discharge, bleeding or signs of irritation  Assessment & Plan:   Problem List Items Addressed This Visit       Musculoskeletal and Integument   Plantar wart of left foot - Primary    Discussed options today.  Unfortunately, we do not have cryotherapy available here in the office.  Discussed alternative topical treatments, specifically discussed topical salicylic acid treatment.  Patient would be amenable to this alternative to cryotherapy.  We discussed appropriate application of the medication.  We will plan for follow-up in about 2 months to assess progress with this topical treatment If not responding as expected, can also consider evaluation and treatment with podiatry        Other   Elevated blood pressure reading without diagnosis of hypertension    Borderline blood pressure in office today.  Recommend intermittent monitoring of blood pressure at home, DASH diet Will continue to monitor blood pressure closely to assess need for initiating pharmacotherapy      OCD (obsessive compulsive disorder)    Symptoms presently well-controlled with use of sertraline.  For now, we can continue with current medication regimen, no changes to be made today       Return in about 2 months (around 10/01/2022) for plantar wart.   Layne Lebon J De 10/03/2022, MD

## 2022-07-31 NOTE — Assessment & Plan Note (Signed)
Symptoms presently well-controlled with use of sertraline.  For now, we can continue with current medication regimen, no changes to be made today

## 2022-07-31 NOTE — Assessment & Plan Note (Signed)
Borderline blood pressure in office today.  Recommend intermittent monitoring of blood pressure at home, DASH diet Will continue to monitor blood pressure closely to assess need for initiating pharmacotherapy

## 2022-08-26 ENCOUNTER — Other Ambulatory Visit: Payer: Self-pay | Admitting: Family

## 2022-10-01 ENCOUNTER — Ambulatory Visit (HOSPITAL_BASED_OUTPATIENT_CLINIC_OR_DEPARTMENT_OTHER): Payer: BC Managed Care – PPO | Admitting: Family Medicine

## 2022-11-21 ENCOUNTER — Encounter (HOSPITAL_BASED_OUTPATIENT_CLINIC_OR_DEPARTMENT_OTHER): Payer: Self-pay | Admitting: Family Medicine

## 2023-01-19 ENCOUNTER — Other Ambulatory Visit: Payer: Self-pay | Admitting: Family

## 2023-02-11 ENCOUNTER — Ambulatory Visit: Payer: BC Managed Care – PPO | Admitting: Emergency Medicine

## 2023-02-11 ENCOUNTER — Encounter: Payer: Self-pay | Admitting: Emergency Medicine

## 2023-02-11 VITALS — BP 132/82 | HR 70 | Temp 97.9°F | Ht 68.0 in | Wt 179.2 lb

## 2023-02-11 DIAGNOSIS — Z1159 Encounter for screening for other viral diseases: Secondary | ICD-10-CM

## 2023-02-11 DIAGNOSIS — Z7689 Persons encountering health services in other specified circumstances: Secondary | ICD-10-CM

## 2023-02-11 DIAGNOSIS — E785 Hyperlipidemia, unspecified: Secondary | ICD-10-CM | POA: Diagnosis not present

## 2023-02-11 DIAGNOSIS — Z8249 Family history of ischemic heart disease and other diseases of the circulatory system: Secondary | ICD-10-CM

## 2023-02-11 DIAGNOSIS — R7303 Prediabetes: Secondary | ICD-10-CM

## 2023-02-11 DIAGNOSIS — Z2989 Encounter for other specified prophylactic measures: Secondary | ICD-10-CM | POA: Diagnosis not present

## 2023-02-11 DIAGNOSIS — Z0184 Encounter for antibody response examination: Secondary | ICD-10-CM

## 2023-02-11 LAB — COMPREHENSIVE METABOLIC PANEL
ALT: 19 U/L (ref 0–53)
AST: 24 U/L (ref 0–37)
Albumin: 4.5 g/dL (ref 3.5–5.2)
Alkaline Phosphatase: 55 U/L (ref 39–117)
BUN: 16 mg/dL (ref 6–23)
CO2: 26 mEq/L (ref 19–32)
Calcium: 9.7 mg/dL (ref 8.4–10.5)
Chloride: 101 mEq/L (ref 96–112)
Creatinine, Ser: 1.19 mg/dL (ref 0.40–1.50)
GFR: 72.12 mL/min (ref >60.00)
Glucose, Bld: 97 mg/dL (ref 70–99)
Potassium: 4.5 mEq/L (ref 3.5–5.1)
Sodium: 136 mEq/L (ref 135–145)
Total Bilirubin: 0.6 mg/dL (ref 0.2–1.2)
Total Protein: 7.6 g/dL (ref 6.0–8.3)

## 2023-02-11 LAB — LIPID PANEL
Cholesterol: 197 mg/dL (ref 0–200)
HDL: 78.1 mg/dL (ref >39.00)
LDL Cholesterol: 104 mg/dL — ABNORMAL HIGH (ref 0–99)
NonHDL: 118.95
Total CHOL/HDL Ratio: 3
Triglycerides: 74 mg/dL (ref 0.0–149.0)
VLDL: 14.8 mg/dL (ref 0.0–40.0)

## 2023-02-11 LAB — CBC WITH DIFFERENTIAL/PLATELET
Basophils Absolute: 0.1 10*3/uL (ref 0.0–0.1)
Basophils Relative: 1.4 % (ref 0.0–3.0)
Eosinophils Absolute: 0.1 10*3/uL (ref 0.0–0.7)
Eosinophils Relative: 1.8 % (ref 0.0–5.0)
HCT: 40.7 % (ref 39.0–52.0)
Hemoglobin: 13.3 g/dL (ref 13.0–17.0)
Lymphocytes Relative: 13.2 % (ref 12.0–46.0)
Lymphs Abs: 0.9 10*3/uL (ref 0.7–4.0)
MCHC: 32.7 g/dL (ref 30.0–36.0)
MCV: 79.2 fl (ref 78.0–100.0)
Monocytes Absolute: 0.4 10*3/uL (ref 0.1–1.0)
Monocytes Relative: 5.2 % (ref 3.0–12.0)
Neutro Abs: 5.6 10*3/uL (ref 1.4–7.7)
Neutrophils Relative %: 78.4 % — ABNORMAL HIGH (ref 43.0–77.0)
Platelets: 257 10*3/uL (ref 150.0–400.0)
RBC: 5.14 Mil/uL (ref 4.22–5.81)
RDW: 15.2 % (ref 11.5–15.5)
WBC: 7.2 10*3/uL (ref 4.0–10.5)

## 2023-02-11 LAB — HEMOGLOBIN A1C: Hgb A1c MFr Bld: 5.9 % (ref 4.6–6.5)

## 2023-02-11 MED ORDER — AZITHROMYCIN 250 MG PO TABS: ORAL_TABLET | ORAL | 0 refills | Status: AC

## 2023-02-11 MED ORDER — ATOVAQUONE-PROGUANIL HCL 250-100 MG PO TABS: 1.0000 | ORAL_TABLET | Freq: Every day | ORAL | 0 refills | Status: DC

## 2023-02-11 MED ORDER — VIVOTIF PO CPDR: 1.0000 | DELAYED_RELEASE_CAPSULE | ORAL | 0 refills | Status: AC

## 2023-02-11 NOTE — Assessment & Plan Note (Signed)
Continues taking daily baby aspirin and atorvastatin 40 mg daily

## 2023-02-11 NOTE — Assessment & Plan Note (Signed)
Diet and nutrition discussed Hemoglobin A1c done today Cardiovascular risks associated with diabetes discussed Benefits of exercise discussed

## 2023-02-11 NOTE — Assessment & Plan Note (Signed)
Inquiring about hepatitis A, B, and C Blood testing done today

## 2023-02-11 NOTE — Assessment & Plan Note (Signed)
Diet and nutrition discussed Lipid profile done today Cardiovascular risks associated with dyslipidemia discussed Continue atorvastatin 40 mg daily

## 2023-02-11 NOTE — Patient Instructions (Signed)
Health Maintenance, Male Adopting a healthy lifestyle and getting preventive care are important in promoting health and wellness. Ask your health care provider about: The right schedule for you to have regular tests and exams. Things you can do on your own to prevent diseases and keep yourself healthy. What should I know about diet, weight, and exercise? Eat a healthy diet  Eat a diet that includes plenty of vegetables, fruits, low-fat dairy products, and lean protein. Do not eat a lot of foods that are high in solid fats, added sugars, or sodium. Maintain a healthy weight Body mass index (BMI) is a measurement that can be used to identify possible weight problems. It estimates body fat based on height and weight. Your health care provider can help determine your BMI and help you achieve or maintain a healthy weight. Get regular exercise Get regular exercise. This is one of the most important things you can do for your health. Most adults should: Exercise for at least 150 minutes each week. The exercise should increase your heart rate and make you sweat (moderate-intensity exercise). Do strengthening exercises at least twice a week. This is in addition to the moderate-intensity exercise. Spend less time sitting. Even light physical activity can be beneficial. Watch cholesterol and blood lipids Have your blood tested for lipids and cholesterol at 49 years of age, then have this test every 5 years. You may need to have your cholesterol levels checked more often if: Your lipid or cholesterol levels are high. You are older than 49 years of age. You are at high risk for heart disease. What should I know about cancer screening? Many types of cancers can be detected early and may often be prevented. Depending on your health history and family history, you may need to have cancer screening at various ages. This may include screening for: Colorectal cancer. Prostate cancer. Skin cancer. Lung  cancer. What should I know about heart disease, diabetes, and high blood pressure? Blood pressure and heart disease High blood pressure causes heart disease and increases the risk of stroke. This is more likely to develop in people who have high blood pressure readings or are overweight. Talk with your health care provider about your target blood pressure readings. Have your blood pressure checked: Every 3-5 years if you are 18-39 years of age. Every year if you are 40 years old or older. If you are between the ages of 65 and 75 and are a current or former smoker, ask your health care provider if you should have a one-time screening for abdominal aortic aneurysm (AAA). Diabetes Have regular diabetes screenings. This checks your fasting blood sugar level. Have the screening done: Once every three years after age 45 if you are at a normal weight and have a low risk for diabetes. More often and at a younger age if you are overweight or have a high risk for diabetes. What should I know about preventing infection? Hepatitis B If you have a higher risk for hepatitis B, you should be screened for this virus. Talk with your health care provider to find out if you are at risk for hepatitis B infection. Hepatitis C Blood testing is recommended for: Everyone born from 1945 through 1965. Anyone with known risk factors for hepatitis C. Sexually transmitted infections (STIs) You should be screened each year for STIs, including gonorrhea and chlamydia, if: You are sexually active and are younger than 49 years of age. You are older than 49 years of age and your   health care provider tells you that you are at risk for this type of infection. Your sexual activity has changed since you were last screened, and you are at increased risk for chlamydia or gonorrhea. Ask your health care provider if you are at risk. Ask your health care provider about whether you are at high risk for HIV. Your health care provider  may recommend a prescription medicine to help prevent HIV infection. If you choose to take medicine to prevent HIV, you should first get tested for HIV. You should then be tested every 3 months for as long as you are taking the medicine. Follow these instructions at home: Alcohol use Do not drink alcohol if your health care provider tells you not to drink. If you drink alcohol: Limit how much you have to 0-2 drinks a day. Know how much alcohol is in your drink. In the [place] one drink equals one 12 oz bottle of beer (355 mL), one 5 oz glass of wine (148 mL), or one 1 oz glass of hard liquor (44 mL). Lifestyle Do not use any products that contain nicotine or tobacco. These products include cigarettes, chewing tobacco, and vaping devices, such as e-cigarettes. If you need help quitting, ask your health care provider. Do not use street drugs. Do not share needles. Ask your health care provider for help if you need support or information about quitting drugs. General instructions Schedule regular health, dental, and eye exams. Stay current with your vaccines. Tell your health care provider if: You often feel depressed. You have ever been abused or do not feel safe at home. Summary Adopting a healthy lifestyle and getting preventive care are important in promoting health and wellness. Follow your health care provider's instructions about healthy diet, exercising, and getting tested or screened for diseases. Follow your health care provider's instructions on monitoring your cholesterol and blood pressure. This information is not intended to replace advice given to you by your health care provider. Make sure you discuss any questions you have with your health care provider. Document Revised: 12/25/2020 Document Reviewed: 12/25/2020 Elsevier Patient Education  2024 [employer]  

## 2023-02-11 NOTE — Progress Notes (Signed)
Isaiah Leonard 47 y.o.   Chief Complaint  Patient presents with   New Patient (Initial Visit)    Patient has not concerns, wants to get labs drawn today.     HISTORY OF PRESENT ILLNESS: This is a 47 y.o. male first visit to this office, here to establish care with me. Healthy male with healthy lifestyle. Traveling to [place] soon.  Needs malaria oandtyphoid fever prophylaxis.  Also requesting traveler's diarrhea prophylaxis History of premature coronary artery disease in the family Has been on atorvastatin for 20 years Also takes daily baby aspirin No other complaints or medical concerns today.   HPI   Prior to Admission medications   Medication Sig Start Date End Date Taking? Authorizing Provider  aspirin EC 81 MG tablet Take 81 mg by mouth daily.   Yes [provider]  atorvastatin (LIPITOR) 40 MG tablet Take 1 tablet (40 mg total) by mouth daily. 12/17/21  Yes Sandford Craze, NP  Salicylic Acid 27.5 % LIQD Apply 1 Application topically daily. 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   Sulfonamide Derivatives Nausea And Vomiting    Patient Active Problem List   Diagnosis Date Noted   OCD (obsessive compulsive disorder) 06/19/2021   Hyperlipidemia 12/19/2015   Arthritis of both hands 12/21/2014   Prediabetes 10/07/2011   Anxiety 04/09/2011   Family history of premature CAD 04/09/2011   History of Guillain-Barre syndrome 07/21/2007   DIVERTICULITIS, HX OF 07/21/2007    Past Medical History:  Diagnosis Date   Allergy    Anxiety    Arthritis    Diverticulitis    History of Guillain-Barre syndrome    age 23   History of shingles    Pure hypercholesterolemia     Past Surgical History:  Procedure  Laterality Date   deviated septum repair  1991   SHOULDER SURGERY Left 05/2014   removed bone spurs    Social History   Socioeconomic History   Marital status: Married    Spouse name: Not on file   Number of children: 1   Years of education: Not on file   Highest education level: Not on file  Occupational History    Employer: [employer]    Comment: Teacher  Tobacco Use   Smoking status: Never   Smokeless tobacco: Never  Substance and Sexual Activity   Alcohol use: Yes    Comment: Occasional   Drug use: No   Sexual activity: Yes    Birth control/protection: None  Other Topics Concern   Not on file  Social History Narrative   Married   [place] - born 2004   Never Smoked   Alcohol use-yes    Occupation:  History Runner, 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    Caffeine use/day:  5 cups coffee daily   Does Patient Exercise:  occasional walks and yard work    Designer, 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-Excessive:  no         Social Determinants of Corporate investment banker Strain: Not on file  Food Insecurity: Not on file  Transportation Needs: Not on file  Physical Activity: Not on file  Stress: Not on file  Social Connections: Not on file  Intimate Partner Violence: Not on file    Family History  Problem Relation Age of Onset   Diabetes Mother    Hypertension Mother    Hyperlipidemia Mother    Hypertension Father    Hyperlipidemia Father    Heart attack Father        MI mid 8s   Heart disease Father    Multiple sclerosis Sister    Stroke Brother        history of etoh,a nd drug abuse   Sudden death Neg Hx      Review of Systems  Constitutional: Negative.  Negative for chills and fever.  HENT: Negative.  Negative for congestion and sore throat.   Respiratory: Negative.  Negative for cough and shortness of breath.   Cardiovascular: Negative.  Negative for chest pain and palpitations.  Gastrointestinal:  Negative for abdominal pain, diarrhea, nausea and vomiting.  Genitourinary: Negative.   Negative for dysuria and hematuria.  Skin: Negative.  Negative for rash.  Neurological: Negative.  Negative for dizziness and headaches.  All other systems reviewed and are negative.   Today's Vitals   02/11/23 0804  BP: 132/82  Pulse: 70  Temp: 97.9 F (36.6 C)  TempSrc: Oral  SpO2: 98%  Weight: 179 lb 4 oz (81.3 kg)  Height: 5\' 8"  (1.727 m)   Body mass index is 27.25 kg/m.   Physical Exam Vitals reviewed.  Constitutional:      Appearance: Normal appearance.  HENT:     Head: Normocephalic.     Mouth/Throat:     Mouth: Mucous membranes are moist.     Pharynx: Oropharynx is clear.  Eyes:     Extraocular Movements: Extraocular movements intact.     Conjunctiva/sclera: Conjunctivae normal.     Pupils: Pupils are equal, round, and reactive to light.  Cardiovascular:     Rate and Rhythm: Normal rate and regular rhythm.     Pulses: Normal pulses.     Heart sounds: Normal heart sounds.  Pulmonary:     Effort: Pulmonary effort is normal.     Breath sounds: Normal breath sounds.  Abdominal:     Palpations: Abdomen is soft.     Tenderness: There is no abdominal tenderness.  Musculoskeletal:     Cervical back: No tenderness.  Lymphadenopathy:     Cervical: No cervical adenopathy.  Skin:    General: Skin is warm and dry.  Neurological:     General: No focal deficit present.     Mental Status: He is alert and oriented to person, place, and time.  Psychiatric:        Mood and Affect: Mood normal.        Behavior: Behavior normal.      ASSESSMENT & PLAN: A total of 48 minutes was spent with the patient and counseling/coordination of care regarding preparing for this visit, review of available medical records, establishing care with me, comprehensive history and physical examination, review of chronic medical conditions and all medications, education and nutrition, prognosis, documentation, and need for follow-up.  Problem List Items Addressed This Visit       Other    Immunity status testing    Inquiring about hepatitis A, B, and C Blood testing done today      Relevant Orders   Hepatitis B surface antibody,quantitative   Hepatitis C antibody   Hepatitis A Ab, Total   Family history of premature CAD    Continues taking daily baby aspirin and atorvastatin 40 mg daily  Prediabetes    Diet and nutrition discussed Hemoglobin A1c done today Cardiovascular risks associated with diabetes discussed Benefits of exercise discussed      Relevant Orders   CBC with Differential/Platelet   Hemoglobin A1c   Dyslipidemia - Primary    Diet and nutrition discussed Lipid profile done today Cardiovascular risks associated with dyslipidemia discussed Continue atorvastatin 40 mg daily      Relevant Orders   CBC with Differential/Platelet   Comprehensive metabolic panel   Lipid panel   Need for malaria prophylaxis    Traveling to [place] soon Prescription for Malarone given Also needs prophylaxis against typhoid fever Recommend Vivotif Recommend to take travelers diarrhea prophylaxis with him in the form of azithromycin antibiotic      Other Visit Diagnoses     Encounter to establish care       Need for hepatitis C screening test       Relevant Orders   Hepatitis C antibody      Patient Instructions  Health Maintenance, Male Adopting a healthy lifestyle and getting preventive care are important in promoting health and wellness. Ask your health care provider about: The right schedule for you to have regular tests and exams. Things you can do on your own to prevent diseases and keep yourself healthy. What should I know about diet, weight, and exercise? Eat a healthy diet  Eat a diet that includes plenty of vegetables, fruits, low-fat dairy products, and lean protein. Do not eat a lot of foods that are high in solid fats, added sugars, or sodium. Maintain a healthy weight Body mass index (BMI) is a measurement that can be used to  identify possible weight problems. It estimates body fat based on height and weight. Your health care provider can help determine your BMI and help you achieve or maintain a healthy weight. Get regular exercise Get regular exercise. This is one of the most important things you can do for your health. Most adults should: Exercise for at least 150 minutes each week. The exercise should increase your heart rate and make you sweat (moderate-intensity exercise). Do strengthening exercises at least twice a week. This is in addition to the moderate-intensity exercise. Spend less time sitting. Even light physical activity can be beneficial. Watch cholesterol and blood lipids Have your blood tested for lipids and cholesterol at 49 years of age, then have this test every 5 years. You may need to have your cholesterol levels checked more often if: Your lipid or cholesterol levels are high. You are older than 49 years of age. You are at high risk for heart disease. What should I know about cancer screening? Many types of cancers can be detected early and may often be prevented. Depending on your health history and family history, you may need to have cancer screening at various ages. This may include screening for: Colorectal cancer. Prostate cancer. Skin cancer. Lung cancer. What should I know about heart disease, diabetes, and high blood pressure? Blood pressure and heart disease High blood pressure causes heart disease and increases the risk of stroke. This is more likely to develop in people who have high blood pressure readings or are overweight. Talk with your health care provider about your target blood pressure readings. Have your blood pressure checked: Every 3-5 years if you are 49-36 years of age. Every year if you are 40 years old or older. If you are between the ages of 58 and 27 and are a current or former smoker, ask  your health care provider if you should have a one-time screening for  abdominal aortic aneurysm (AAA). Diabetes Have regular diabetes screenings. This checks your fasting blood sugar level. Have the screening done: Once every three years after age 28 if you are at a normal weight and have a low risk for diabetes. More often and at a younger age if you are overweight or have a high risk for diabetes. What should I know about preventing infection? Hepatitis B If you have a higher risk for hepatitis B, you should be screened for this virus. Talk with your health care provider to find out if you are at risk for hepatitis B infection. Hepatitis C Blood testing is recommended for: Everyone born from [birth year] through 1965. Anyone with known risk factors for hepatitis C. Sexually transmitted infections (STIs) You should be screened each year for STIs, including gonorrhea and chlamydia, if: You are sexually active and are younger than 49 years of age. You are older than 49 years of age and your health care provider tells you that you are at risk for this type of infection. Your sexual activity has changed since you were last screened, and you are at increased risk for chlamydia or gonorrhea. Ask your health care provider if you are at risk. Ask your health care provider about whether you are at high risk for HIV. Your health care provider may recommend a prescription medicine to help prevent HIV infection. If you choose to take medicine to prevent HIV, you should first get tested for HIV. You should then be tested every 3 months for as long as you are taking the medicine. Follow these instructions at home: Alcohol use Do not drink alcohol if your health care provider tells you not to drink. If you drink alcohol: Limit how much you have to 0-2 drinks a day. Know how much alcohol is in your drink. In the [place] one drink equals one 12 oz bottle of beer (355 mL), one 5 oz glass of wine (148 mL), or one 1 oz glass of hard liquor (44 mL). Lifestyle Do not use any products that  contain nicotine or tobacco. These products include cigarettes, chewing tobacco, and vaping devices, such as e-cigarettes. If you need help quitting, ask your health care provider. Do not use street drugs. Do not share needles. Ask your health care provider for help if you need support or information about quitting drugs. General instructions Schedule regular health, dental, and eye exams. Stay current with your vaccines. Tell your health care provider if: You often feel depressed. You have ever been abused or do not feel safe at home. Summary Adopting a healthy lifestyle and getting preventive care are important in promoting health and wellness. Follow your health care provider's instructions about healthy diet, exercising, and getting tested or screened for diseases. Follow your health care provider's instructions on monitoring your cholesterol and blood pressure. This information is not intended to replace advice given to you by your health care provider. Make sure you discuss any questions you have with your health care provider. Document Revised: 12/25/2020 Document Reviewed: 12/25/2020 Elsevier Patient Education  2024 Elsevier Inc.       Edwina Barth, MD [facility] at [facility]

## 2023-02-11 NOTE — Assessment & Plan Note (Signed)
Traveling to [place] soon Prescription for Malarone given Also needs prophylaxis against typhoid fever Recommend Vivotif Recommend to take travelers diarrhea prophylaxis with him in the form of azithromycin antibiotic

## 2023-02-12 LAB — HEPATITIS C ANTIBODY: Hepatitis C Ab: NONREACTIVE

## 2023-02-12 LAB — HEPATITIS B SURFACE ANTIBODY, QUANTITATIVE: Hep B S AB Quant (Post): <5 m[IU]/mL — ABNORMAL LOW (ref >=10)

## 2023-02-12 LAB — HEPATITIS A ANTIBODY, TOTAL: Hepatitis A AB,Total: NONREACTIVE

## 2023-03-12 ENCOUNTER — Other Ambulatory Visit: Payer: Self-pay | Admitting: Emergency Medicine

## 2023-03-12 ENCOUNTER — Other Ambulatory Visit: Payer: Self-pay | Admitting: Family

## 2023-03-17 ENCOUNTER — Telehealth: Payer: Self-pay | Admitting: Emergency Medicine

## 2023-03-17 ENCOUNTER — Ambulatory Visit (INDEPENDENT_AMBULATORY_CARE_PROVIDER_SITE_OTHER): Payer: BC Managed Care – PPO | Admitting: Radiology

## 2023-03-17 DIAGNOSIS — Z23 Encounter for immunization: Secondary | ICD-10-CM | POA: Diagnosis not present

## 2023-03-17 NOTE — Telephone Encounter (Signed)
Pt called in this morning wanting an Hep A shot. Please advise.

## 2023-03-17 NOTE — Progress Notes (Signed)
Pt her for Hep A shot. Pt tolerated well with no complications.

## 2023-03-17 NOTE — Telephone Encounter (Signed)
OK to get.

## 2023-05-01 ENCOUNTER — Ambulatory Visit: Payer: BC Managed Care – PPO | Admitting: Emergency Medicine

## 2023-05-01 ENCOUNTER — Encounter: Payer: Self-pay | Admitting: Emergency Medicine

## 2023-05-01 VITALS — BP 120/76 | HR 71 | Temp 98.2°F | Ht 68.0 in | Wt 179.2 lb

## 2023-05-01 DIAGNOSIS — R221 Localized swelling, mass and lump, neck: Secondary | ICD-10-CM | POA: Diagnosis not present

## 2023-05-01 DIAGNOSIS — L723 Sebaceous cyst: Secondary | ICD-10-CM | POA: Insufficient documentation

## 2023-05-01 NOTE — Assessment & Plan Note (Signed)
Differential diagnosis discussed with patient. Lymphoma very unlikely Most likely sebaceous cyst Recommend soft tissue ultrasound of neck

## 2023-05-01 NOTE — Progress Notes (Signed)
Isaiah Leonard 47 y.o.   Chief Complaint  Patient presents with   Medical Management of Chronic Issues    Patient states he has a small "lump" on his neck. He notice it about a week 1/2 ago. Not painful.     HISTORY OF PRESENT ILLNESS: This is a 48 y.o. male complaining of lump to right side of his neck Noticed it about a week ago.  Better today.  No associated symptoms. No other complaints or medical concerns today.  HPI   Prior to Admission medications   Medication Sig Start Date End Date Taking? Authorizing Provider  aspirin EC 81 MG tablet Take 81 mg by mouth daily.   Yes [provider]  atorvastatin (LIPITOR) 40 MG tablet TAKE 1 TABLET BY MOUTH EVERY DAY 03/12/23  Yes Zuzanna Maroney, Eilleen Kempf, MD  atovaquone-proguanil (MALARONE) 250-100 MG TABS tablet TAKE 1 TAB BY MOUTH DAILY. START 2 DAYS BEFORE TRAVEL, DAILY WHILE ABROAD & DAILY FOR 7 DAYS AFTER 03/12/23  Yes Georgina Quint, MD  azithromycin [facility]) 250 MG tablet Sig as indicated 02/11/23  Yes Henley Blyth, Eilleen Kempf, MD  Salicylic Acid 27.5 % LIQD Apply 1 Application topically daily. 07/31/22  Yes de [place], Raymond J, MD  sertraline (ZOLOFT) 50 MG tablet TAKE 1 TABLET BY MOUTH EVERY DAY 08/26/22  Yes de [place], Raymond J, MD  typhoid (VIVOTIF) DR capsule Take 1 capsule by mouth every other day. 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   Sulfonamide Derivatives Nausea And Vomiting    Patient Active Problem List   Diagnosis Date Noted   OCD (obsessive compulsive disorder) 06/19/2021   Dyslipidemia 12/19/2015   Arthritis of both hands 12/21/2014   Prediabetes 10/07/2011   Anxiety 04/09/2011   Family history of premature CAD 04/09/2011   History of Guillain-Barre syndrome 07/21/2007   DIVERTICULITIS, HX OF 07/21/2007    Past Medical History:  Diagnosis  Date   Allergy    Anxiety    Arthritis    Diverticulitis    History of Guillain-Barre syndrome    age 55   History of shingles    Pure hypercholesterolemia     Past Surgical History:  Procedure Laterality Date   deviated septum repair  1991   SHOULDER SURGERY Left 05/2014   removed bone spurs    Social History   Socioeconomic History   Marital status: Married    Spouse name: Not on file   Number of children: 1   Years of education: Not on file   Highest education level: Not on file  Occupational History    Employer: [employer]    Comment: Teacher  Tobacco Use   Smoking status: Never   Smokeless tobacco: Never  Substance and Sexual Activity   Alcohol use: Yes    Comment: Occasional   Drug use: No   Sexual activity: Yes    Birth control/protection: None  Other Topics Concern   Not on file  Social History Narrative   Married   [place] - born 2004   Never Smoked   Alcohol use-yes    Occupation:  History Runner, 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    Caffeine use/day:  5 cups coffee daily   Does Patient Exercise:  occasional walks and yard work    [employer] Exposure-Excessive:  no  Social Determinants of Health   Financial Resource Strain: Not on file  Food Insecurity: Not on file  Transportation Needs: Not on file  Physical Activity: Not on file  Stress: Not on file  Social Connections: Not on file  Intimate Partner Violence: Not on file    Family History  Problem Relation Age of Onset   Diabetes Mother    Hypertension Mother    Hyperlipidemia Mother    Hypertension Father    Hyperlipidemia Father    Heart attack Father        MI mid 76s   Heart disease Father    Multiple sclerosis Sister    Stroke Brother        history of etoh,a nd drug abuse   Sudden death Neg Hx      Review of Systems  Constitutional: Negative.  Negative for chills and fever.  HENT: Negative.  Negative for congestion and sore throat.   Respiratory: Negative.  Negative for cough and  shortness of breath.   Cardiovascular: Negative.  Negative for chest pain and palpitations.  Gastrointestinal:  Negative for abdominal pain, diarrhea, nausea and vomiting.  Skin: Negative.  Negative for rash.  Neurological: Negative.  Negative for dizziness and headaches.  All other systems reviewed and are negative.   Vitals:   05/01/23 1431  BP: 120/76  Pulse: 71  Temp: 98.2 F (36.8 C)  SpO2: 98%    Physical Exam Vitals reviewed.  Constitutional:      Appearance: Normal appearance.  HENT:     Head: Normocephalic.     Mouth/Throat:     Mouth: Mucous membranes are moist.     Pharynx: Oropharynx is clear.  Eyes:     Extraocular Movements: Extraocular movements intact.     Pupils: Pupils are equal, round, and reactive to light.  Neck:     Comments: Small nontender sebaceous cyst on the right mandible Cardiovascular:     Rate and Rhythm: Normal rate and regular rhythm.     Pulses: Normal pulses.     Heart sounds: Normal heart sounds.  Pulmonary:     Effort: Pulmonary effort is normal.     Breath sounds: Normal breath sounds.  Musculoskeletal:     Cervical back: No tenderness.  Lymphadenopathy:     Cervical: No cervical adenopathy.  Skin:    General: Skin is warm and dry.  Neurological:     Mental Status: He is alert and oriented to person, place, and time.  Psychiatric:        Mood and Affect: Mood normal.        Behavior: Behavior normal.      ASSESSMENT & PLAN: A total of 32 minutes was spent with the patient and counseling/coordination of care regarding preparing for this visit, review of most recent office visit notes, differential diagnosis of lump in the neck, need for soft tissue ultrasound of neck, prognosis, documentation, and need for follow-up.  Problem List Items Addressed This Visit       Musculoskeletal and Integument   Sebaceous cyst    Of neck area.  Nontender.  Not infected. Benign finding.  No concerns.        Other   Lump in neck -  Primary    Differential diagnosis discussed with patient. Lymphoma very unlikely Most likely sebaceous cyst Recommend soft tissue ultrasound of neck      Relevant Orders   US Soft Tissue Head/Neck (NON-THYROID)   Patient Instructions  Epidermoid Cyst  An  epidermoid cyst, also called an epidermal cyst, is a small lump under your skin. The cyst contains a substance called keratin. Do not try to pop or open the cyst yourself. What are the causes? A blocked hair follicle. A hair that curls and re-enters the skin instead of growing straight out of the skin. A blocked pore. Irritated skin. An injury to the skin. Certain conditions that are passed along from parent to child. Human papillomavirus (HPV). This happens rarely when cysts occur on the bottom of the feet. Long-term sun damage to the skin. What increases the risk? Having acne. Being male. Having an injury to the skin. Being past puberty. Having certain conditions caused by genes (genetic disorder) What are the signs or symptoms? These cysts are usually harmless, but they can get infected. Symptoms of infection may include: Redness. Inflammation. Tenderness. Warmth. Fever. A bad-smelling substance that drains from the cyst. Pus that drains from the cyst. How is this treated? In many cases, epidermoid cysts go away on their own without treatment. If a cyst becomes infected, treatment may include: Opening and draining the cyst, done by a doctor. After draining, you may need minor surgery to remove the rest of the cyst. Antibiotic medicine. Shots of medicines (steroids) that help to reduce inflammation. Surgery to remove the cyst. Surgery may be done if the cyst: Becomes large. Bothers you. Has a chance of turning into cancer. Do not try to open a cyst yourself. Follow these instructions at home: Medicines Take over-the-counter and prescription medicines as told by your doctor. If you were prescribed an antibiotic  medicine, take it as told by your doctor. Do not stop taking it even if you start to feel better. General instructions Keep the area around your cyst clean and dry. Wear loose, dry clothing. Avoid touching your cyst. Check your cyst every day for signs of infection. Check for: Redness, swelling, or pain. Fluid or blood. Warmth. Pus or a bad smell. Keep all follow-up visits. How is this prevented? Wear clean, dry, clothing. Avoid wearing tight clothing. Keep your skin clean and dry. Take showers or baths every day. Contact a doctor if: Your cyst has symptoms of infection. Your condition does not improve or gets worse. You have a cyst that looks different from other cysts you have had. You have a fever. Get help right away if: Redness spreads from the cyst into the area close by. Summary An epidermoid cyst is a small lump under your skin. If a cyst becomes infected, treatment may include surgery to open and drain the cyst, or to remove it. Take over-the-counter and prescription medicines only as told by your doctor. Contact a doctor if your condition is not improving or is getting worse. Keep all follow-up visits. This information is not intended to replace advice given to you by your health care provider. Make sure you discuss any questions you have with your health care provider. Document Revised: 11/09/2019 Document Reviewed: 11/10/2019 Elsevier Patient Education  2024 Elsevier Inc.     Edwina Barth, MD [facility] at [facility]

## 2023-05-01 NOTE — Assessment & Plan Note (Signed)
Of neck area.  Nontender.  Not infected. Benign finding.  No concerns.

## 2023-05-01 NOTE — Patient Instructions (Signed)
Epidermoid Cyst  An epidermoid cyst, also called an epidermal cyst, is a small lump under your skin. The cyst contains a substance called keratin. Do not try to pop or open the cyst yourself. What are the causes? A blocked hair follicle. A hair that curls and re-enters the skin instead of growing straight out of the skin. A blocked pore. Irritated skin. An injury to the skin. Certain conditions that are passed along from parent to child. Human papillomavirus (HPV). This happens rarely when cysts occur on the bottom of the feet. Long-term sun damage to the skin. What increases the risk? Having acne. Being male. Having an injury to the skin. Being past puberty. Having certain conditions caused by genes (genetic disorder) What are the signs or symptoms? These cysts are usually harmless, but they can get infected. Symptoms of infection may include: Redness. Inflammation. Tenderness. Warmth. Fever. A bad-smelling substance that drains from the cyst. Pus that drains from the cyst. How is this treated? In many cases, epidermoid cysts go away on their own without treatment. If a cyst becomes infected, treatment may include: Opening and draining the cyst, done by a doctor. After draining, you may need minor surgery to remove the rest of the cyst. Antibiotic medicine. Shots of medicines (steroids) that help to reduce inflammation. Surgery to remove the cyst. Surgery may be done if the cyst: Becomes large. Bothers you. Has a chance of turning into cancer. Do not try to open a cyst yourself. Follow these instructions at home: Medicines Take over-the-counter and prescription medicines as told by your doctor. If you were prescribed an antibiotic medicine, take it as told by your doctor. Do not stop taking it even if you start to feel better. General instructions Keep the area around your cyst clean and dry. Wear loose, dry clothing. Avoid touching your cyst. Check your cyst every day  for signs of infection. Check for: Redness, swelling, or pain. Fluid or blood. Warmth. Pus or a bad smell. Keep all follow-up visits. How is this prevented? Wear clean, dry, clothing. Avoid wearing tight clothing. Keep your skin clean and dry. Take showers or baths every day. Contact a doctor if: Your cyst has symptoms of infection. Your condition does not improve or gets worse. You have a cyst that looks different from other cysts you have had. You have a fever. Get help right away if: Redness spreads from the cyst into the area close by. Summary An epidermoid cyst is a small lump under your skin. If a cyst becomes infected, treatment may include surgery to open and drain the cyst, or to remove it. Take over-the-counter and prescription medicines only as told by your doctor. Contact a doctor if your condition is not improving or is getting worse. Keep all follow-up visits. This information is not intended to replace advice given to you by your health care provider. Make sure you discuss any questions you have with your health care provider. Document Revised: 11/09/2019 Document Reviewed: 11/10/2019 Elsevier Patient Education  2024 [employer].

## 2023-05-08 ENCOUNTER — Ambulatory Visit
Admission: RE | Admit: 2023-05-08 | Discharge: 2023-05-08 | Disposition: A | Discharge status: Home / Self Care | Payer: BC Managed Care – PPO | Source: Ambulatory Visit | Attending: Emergency Medicine | Admitting: Emergency Medicine

## 2023-05-08 DIAGNOSIS — R221 Localized swelling, mass and lump, neck: Secondary | ICD-10-CM

## 2023-06-10 ENCOUNTER — Ambulatory Visit: Payer: BC Managed Care – PPO | Admitting: Emergency Medicine

## 2023-06-10 ENCOUNTER — Encounter: Payer: Self-pay | Admitting: Emergency Medicine

## 2023-06-10 VITALS — BP 128/82 | HR 87 | Temp 97.8°F | Ht 68.0 in | Wt 176.0 lb

## 2023-06-10 DIAGNOSIS — R21 Rash and other nonspecific skin eruption: Secondary | ICD-10-CM | POA: Insufficient documentation

## 2023-06-10 NOTE — Progress Notes (Signed)
Isaiah Leonard 47 y.o.   Chief Complaint  Patient presents with   Rash    Bumps on torso first noticed a week/week and a half ago. Itching and redness, has used anti-itch cream    HISTORY OF PRESENT ILLNESS: This is a 47 y.o. male complaining of diffuse itchy rash noticed 1 week ago.  Unknown cause.  Better today. No other associated symptoms. No other complaints or medical concerns today  HPI   Prior to Admission medications   Medication Sig Start Date End Date Taking? Authorizing Provider  aspirin EC 81 MG tablet Take 81 mg by mouth daily.   Yes [provider]  atorvastatin (LIPITOR) 40 MG tablet TAKE 1 TABLET BY MOUTH EVERY DAY 03/12/23  Yes Georgina Quint, MD  azithromycin [facility]) 250 MG tablet Sig as indicated 02/11/23  Yes Tyianna Menefee, Eilleen Kempf, MD  Salicylic Acid 27.5 % LIQD Apply 1 Application topically daily. 07/31/22  Yes de [place], Raymond J, MD  sertraline (ZOLOFT) 50 MG tablet TAKE 1 TABLET BY MOUTH EVERY DAY 08/26/22  Yes de [place], Raymond J, MD  typhoid (VIVOTIF) DR capsule Take 1 capsule by mouth every other day. 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   Sulfonamide Derivatives Nausea And Vomiting    Patient Active Problem List   Diagnosis Date Noted   Sebaceous cyst 05/01/2023   Lump in neck 05/01/2023   OCD (obsessive compulsive disorder) 06/19/2021   Dyslipidemia 12/19/2015   Arthritis of both hands 12/21/2014   Prediabetes 10/07/2011   Anxiety 04/09/2011   Family history of premature CAD 04/09/2011   History of Guillain-Barre syndrome 07/21/2007   DIVERTICULITIS, HX OF 07/21/2007    Past Medical History:  Diagnosis Date   Allergy    Anxiety    Arthritis    Diverticulitis    History of Guillain-Barre syndrome    age 68   History of shingles    Pure hypercholesterolemia      Past Surgical History:  Procedure Laterality Date   deviated septum repair  1991   SHOULDER SURGERY Left 05/2014   removed bone spurs    Social History   Socioeconomic History   Marital status: Married    Spouse name: Not on file   Number of children: 1   Years of education: Not on file   Highest education level: Not on file  Occupational History    Employer: [employer]    Comment: Teacher  Tobacco Use   Smoking status: Never   Smokeless tobacco: Never  Substance and Sexual Activity   Alcohol use: Yes    Comment: Occasional   Drug use: No   Sexual activity: Yes    Birth control/protection: None  Other Topics Concern   Not on file  Social History Narrative   Married   [place] - born 2004   Never Smoked   Alcohol use-yes    Occupation:  History Runner, 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    Caffeine use/day:  5 cups coffee daily   Does Patient Exercise:  occasional walks and yard work    Designer, 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-Excessive:  no         Social Determinants of Corporate investment banker Strain: Not on file  Food Insecurity: Not on file  Transportation Needs: Not on file  Physical  Activity: Not on file  Stress: Not on file  Social Connections: Not on file  Intimate Partner Violence: Not on file    Family History  Problem Relation Age of Onset   Diabetes Mother    Hypertension Mother    Hyperlipidemia Mother    Hypertension Father    Hyperlipidemia Father    Heart attack Father        MI mid 70s   Heart disease Father    Multiple sclerosis Sister    Stroke Brother        history of etoh,a nd drug abuse   Sudden death Neg Hx      Review of Systems  Constitutional: Negative.  Negative for chills and fever.  HENT:  Negative for congestion and sore throat.   Respiratory: Negative.  Negative for cough and shortness of breath.   Cardiovascular: Negative.  Negative for chest pain and palpitations.  Gastrointestinal:  Negative for abdominal pain, diarrhea, nausea and vomiting.   Genitourinary: Negative.  Negative for dysuria and hematuria.  Skin:  Positive for itching and rash.  Neurological: Negative.  Negative for dizziness and headaches.  All other systems reviewed and are negative.   Vitals:   06/10/23 1318  BP: 128/82  Pulse: 87  Temp: 97.8 F (36.6 C)  SpO2: 97%    Physical Exam Vitals reviewed.  Constitutional:      Appearance: Normal appearance.  HENT:     Head: Normocephalic.  Eyes:     Extraocular Movements: Extraocular movements intact.  Cardiovascular:     Rate and Rhythm: Normal rate.  Pulmonary:     Effort: Pulmonary effort is normal.  Skin:    General: Skin is warm and dry.     Findings: Rash present.     Comments: Faint maculopapular rash to trunk.  Also but to a lesser extent in extremities  Neurological:     Mental Status: He is alert and oriented to person, place, and time.  Psychiatric:        Mood and Affect: Mood normal.        Behavior: Behavior normal.      ASSESSMENT & PLAN: A total of 32 minutes was spent with the patient and counseling/coordination of care regarding preparing for this visit, review of most recent office visit notes, review of most recent blood work results, differential diagnosis of nonspecific rash and management, prognosis, ED precautions, documentation, and need for follow-up if no better or worse during the next several days..  Problem List Items Addressed This Visit       Musculoskeletal and Integument   Rash and nonspecific skin eruption - Primary    Clinically stable.  No red flag signs or symptoms. Differential diagnosis discussed with patient. Recommend symptomatic treatment at present time. ED precautions given. Advised to contact the office if no better or worse during the next several days.      Patient Instructions  Health Maintenance, Male Adopting a healthy lifestyle and getting preventive care are important in promoting health and wellness. Ask your health care provider  about: The right schedule for you to have regular tests and exams. Things you can do on your own to prevent diseases and keep yourself healthy. What should I know about diet, weight, and exercise? Eat a healthy diet  Eat a diet that includes plenty of vegetables, fruits, low-fat dairy products, and lean protein. Do not eat a lot of foods that are high in solid fats, added sugars, or sodium. Maintain a  healthy weight Body mass index (BMI) is a measurement that can be used to identify possible weight problems. It estimates body fat based on height and weight. Your health care provider can help determine your BMI and help you achieve or maintain a healthy weight. Get regular exercise Get regular exercise. This is one of the most important things you can do for your health. Most adults should: Exercise for at least 150 minutes each week. The exercise should increase your heart rate and make you sweat (moderate-intensity exercise). Do strengthening exercises at least twice a week. This is in addition to the moderate-intensity exercise. Spend less time sitting. Even light physical activity can be beneficial. Watch cholesterol and blood lipids Have your blood tested for lipids and cholesterol at 49 years of age, then have this test every 5 years. You may need to have your cholesterol levels checked more often if: Your lipid or cholesterol levels are high. You are older than 49 years of age. You are at high risk for heart disease. What should I know about cancer screening? Many types of cancers can be detected early and may often be prevented. Depending on your health history and family history, you may need to have cancer screening at various ages. This may include screening for: Colorectal cancer. Prostate cancer. Skin cancer. Lung cancer. What should I know about heart disease, diabetes, and high blood pressure? Blood pressure and heart disease High blood pressure causes heart disease and  increases the risk of stroke. This is more likely to develop in people who have high blood pressure readings or are overweight. Talk with your health care provider about your target blood pressure readings. Have your blood pressure checked: Every 3-5 years if you are 21-68 years of age. Every year if you are 76 years old or older. If you are between the ages of 41 and 37 and are a current or former smoker, ask your health care provider if you should have a one-time screening for abdominal aortic aneurysm (AAA). Diabetes Have regular diabetes screenings. This checks your fasting blood sugar level. Have the screening done: Once every three years after age 63 if you are at a normal weight and have a low risk for diabetes. More often and at a younger age if you are overweight or have a high risk for diabetes. What should I know about preventing infection? Hepatitis B If you have a higher risk for hepatitis B, you should be screened for this virus. Talk with your health care provider to find out if you are at risk for hepatitis B infection. Hepatitis C Blood testing is recommended for: Everyone born from [birth year] through 1965. Anyone with known risk factors for hepatitis C. Sexually transmitted infections (STIs) You should be screened each year for STIs, including gonorrhea and chlamydia, if: You are sexually active and are younger than 49 years of age. You are older than 49 years of age and your health care provider tells you that you are at risk for this type of infection. Your sexual activity has changed since you were last screened, and you are at increased risk for chlamydia or gonorrhea. Ask your health care provider if you are at risk. Ask your health care provider about whether you are at high risk for HIV. Your health care provider may recommend a prescription medicine to help prevent HIV infection. If you choose to take medicine to prevent HIV, you should first get tested for HIV. You should  then be tested every 3  months for as long as you are taking the medicine. Follow these instructions at home: Alcohol use Do not drink alcohol if your health care provider tells you not to drink. If you drink alcohol: Limit how much you have to 0-2 drinks a day. Know how much alcohol is in your drink. In the [place] one drink equals one 12 oz bottle of beer (355 mL), one 5 oz glass of wine (148 mL), or one 1 oz glass of hard liquor (44 mL). Lifestyle Do not use any products that contain nicotine or tobacco. These products include cigarettes, chewing tobacco, and vaping devices, such as e-cigarettes. If you need help quitting, ask your health care provider. Do not use street drugs. Do not share needles. Ask your health care provider for help if you need support or information about quitting drugs. General instructions Schedule regular health, dental, and eye exams. Stay current with your vaccines. Tell your health care provider if: You often feel depressed. You have ever been abused or do not feel safe at home. Summary Adopting a healthy lifestyle and getting preventive care are important in promoting health and wellness. Follow your health care provider's instructions about healthy diet, exercising, and getting tested or screened for diseases. Follow your health care provider's instructions on monitoring your cholesterol and blood pressure. This information is not intended to replace advice given to you by your health care provider. Make sure you discuss any questions you have with your health care provider. Document Revised: 12/25/2020 Document Reviewed: 12/25/2020 Elsevier Patient Education  2024 Elsevier Inc.    Edwina Barth, MD [facility]

## 2023-06-10 NOTE — Patient Instructions (Signed)
Health Maintenance, Male Adopting a healthy lifestyle and getting preventive care are important in promoting health and wellness. Ask your health care provider about: The right schedule for you to have regular tests and exams. Things you can do on your own to prevent diseases and keep yourself healthy. What should I know about diet, weight, and exercise? Eat a healthy diet  Eat a diet that includes plenty of vegetables, fruits, low-fat dairy products, and lean protein. Do not eat a lot of foods that are high in solid fats, added sugars, or sodium. Maintain a healthy weight Body mass index (BMI) is a measurement that can be used to identify possible weight problems. It estimates body fat based on height and weight. Your health care provider can help determine your BMI and help you achieve or maintain a healthy weight. Get regular exercise Get regular exercise. This is one of the most important things you can do for your health. Most adults should: Exercise for at least 150 minutes each week. The exercise should increase your heart rate and make you sweat (moderate-intensity exercise). Do strengthening exercises at least twice a week. This is in addition to the moderate-intensity exercise. Spend less time sitting. Even light physical activity can be beneficial. Watch cholesterol and blood lipids Have your blood tested for lipids and cholesterol at 49 years of age, then have this test every 5 years. You may need to have your cholesterol levels checked more often if: Your lipid or cholesterol levels are high. You are older than 49 years of age. You are at high risk for heart disease. What should I know about cancer screening? Many types of cancers can be detected early and may often be prevented. Depending on your health history and family history, you may need to have cancer screening at various ages. This may include screening for: Colorectal cancer. Prostate cancer. Skin cancer. Lung  cancer. What should I know about heart disease, diabetes, and high blood pressure? Blood pressure and heart disease High blood pressure causes heart disease and increases the risk of stroke. This is more likely to develop in people who have high blood pressure readings or are overweight. Talk with your health care provider about your target blood pressure readings. Have your blood pressure checked: Every 3-5 years if you are 18-39 years of age. Every year if you are 40 years old or older. If you are between the ages of 65 and 75 and are a current or former smoker, ask your health care provider if you should have a one-time screening for abdominal aortic aneurysm (AAA). Diabetes Have regular diabetes screenings. This checks your fasting blood sugar level. Have the screening done: Once every three years after age 45 if you are at a normal weight and have a low risk for diabetes. More often and at a younger age if you are overweight or have a high risk for diabetes. What should I know about preventing infection? Hepatitis B If you have a higher risk for hepatitis B, you should be screened for this virus. Talk with your health care provider to find out if you are at risk for hepatitis B infection. Hepatitis C Blood testing is recommended for: Everyone born from 1945 through 1965. Anyone with known risk factors for hepatitis C. Sexually transmitted infections (STIs) You should be screened each year for STIs, including gonorrhea and chlamydia, if: You are sexually active and are younger than 49 years of age. You are older than 49 years of age and your   health care provider tells you that you are at risk for this type of infection. Your sexual activity has changed since you were last screened, and you are at increased risk for chlamydia or gonorrhea. Ask your health care provider if you are at risk. Ask your health care provider about whether you are at high risk for HIV. Your health care provider  may recommend a prescription medicine to help prevent HIV infection. If you choose to take medicine to prevent HIV, you should first get tested for HIV. You should then be tested every 3 months for as long as you are taking the medicine. Follow these instructions at home: Alcohol use Do not drink alcohol if your health care provider tells you not to drink. If you drink alcohol: Limit how much you have to 0-2 drinks a day. Know how much alcohol is in your drink. In the [place] one drink equals one 12 oz bottle of beer (355 mL), one 5 oz glass of wine (148 mL), or one 1 oz glass of hard liquor (44 mL). Lifestyle Do not use any products that contain nicotine or tobacco. These products include cigarettes, chewing tobacco, and vaping devices, such as e-cigarettes. If you need help quitting, ask your health care provider. Do not use street drugs. Do not share needles. Ask your health care provider for help if you need support or information about quitting drugs. General instructions Schedule regular health, dental, and eye exams. Stay current with your vaccines. Tell your health care provider if: You often feel depressed. You have ever been abused or do not feel safe at home. Summary Adopting a healthy lifestyle and getting preventive care are important in promoting health and wellness. Follow your health care provider's instructions about healthy diet, exercising, and getting tested or screened for diseases. Follow your health care provider's instructions on monitoring your cholesterol and blood pressure. This information is not intended to replace advice given to you by your health care provider. Make sure you discuss any questions you have with your health care provider. Document Revised: 12/25/2020 Document Reviewed: 12/25/2020 Elsevier Patient Education  2024 [employer]  

## 2023-06-10 NOTE — Assessment & Plan Note (Signed)
Clinically stable.  No red flag signs or symptoms. Differential diagnosis discussed with patient. Recommend symptomatic treatment at present time. ED precautions given. Advised to contact the office if no better or worse during the next several days.

## 2023-07-16 ENCOUNTER — Other Ambulatory Visit (HOSPITAL_BASED_OUTPATIENT_CLINIC_OR_DEPARTMENT_OTHER): Payer: Self-pay

## 2023-07-16 MED ORDER — COMIRNATY 30 MCG/0.3ML IM SUSY
0.3000 mL | PREFILLED_SYRINGE | Freq: Once | INTRAMUSCULAR | 0 refills | Status: AC
  Filled 2023-07-16: qty 0.3, 1d supply, fill #0

## 2023-09-15 ENCOUNTER — Ambulatory Visit (INDEPENDENT_AMBULATORY_CARE_PROVIDER_SITE_OTHER): Payer: 59

## 2023-09-15 DIAGNOSIS — Z23 Encounter for immunization: Secondary | ICD-10-CM

## 2023-09-15 NOTE — Progress Notes (Signed)
Patient presented in office today for his 2nd dose of Hep A. IM Injection given in his R Deltoid muscle. Patient tolerated the injection well and the injection site looks fine. Advised the patient to repost to the office immediately if he notices any adverse reactions. He gave a verbal understanding.

## 2023-10-01 ENCOUNTER — Telehealth: Payer: Self-pay | Admitting: Emergency Medicine

## 2023-10-01 NOTE — Telephone Encounter (Signed)
Copied from CRM [id number]. Topic: Clinical - Medication Refill >> Oct 01, 2023  9:56 AM [name] wrote: Most Recent Primary Care Visit:  Provider: Daryll Brod  Department: [facility]  Visit Type: CLINICAL SUPPORT  Date: 09/15/2023  Medication: sertraline (ZOLOFT) 50 MG tablet, atorvastatin (LIPITOR) 40 MG tablet  Has the patient contacted their pharmacy? Yes (Agent: If no, request that the patient contact the pharmacy for the refill. If patient does not wish to contact the pharmacy document the reason why and proceed with request.) (Agent: If yes, when and what did the pharmacy advise?)  Is this the correct pharmacy for this prescription? Yes If no, delete pharmacy and type the correct one.  This is the patient's preferred pharmacy:  [id number] - [place], [place] - [address] AT [address] [facility] [address] [place] [address] Phone: [phone] Fax: [phone]   Has the prescription been filled recently? Yes  Is the patient out of the medication? Yes  Has the patient been seen for an appointment in the last year OR does the patient have an upcoming appointment? Yes  Can we respond through MyChart? Yes  Agent: Please be advised that Rx refills may take up to 3 business days. We ask that you follow-up with your pharmacy.

## 2023-10-05 ENCOUNTER — Other Ambulatory Visit: Payer: Self-pay | Admitting: Emergency Medicine

## 2023-10-07 ENCOUNTER — Encounter: Payer: Self-pay | Admitting: Emergency Medicine

## 2023-10-07 NOTE — Telephone Encounter (Signed)
 Patient called because he has not received a refill of his Zoloft. He would like to know if it can get that filled as soon as possible as the message was in the system on 10/01/23, but not routed. Best callback is [phone].

## 2023-10-08 ENCOUNTER — Other Ambulatory Visit: Payer: Self-pay | Admitting: Radiology

## 2023-10-08 MED ORDER — ATORVASTATIN CALCIUM 40 MG PO TABS: 40.0000 mg | ORAL_TABLET | Freq: Every day | ORAL | 1 refills | Status: DC

## 2023-10-08 MED ORDER — SERTRALINE HCL 50 MG PO TABS: 50.0000 mg | ORAL_TABLET | Freq: Every day | ORAL | 1 refills | Status: DC

## 2023-10-08 NOTE — Telephone Encounter (Signed)
 Pt has stated "I am out of my atorvastatin and my sertraline and need refills as I have been not taking them since I have run out and am unable to obtain refills"  Pt was last seen 06/10/2023.

## 2024-02-11 ENCOUNTER — Ambulatory Visit (INDEPENDENT_AMBULATORY_CARE_PROVIDER_SITE_OTHER): Admitting: Emergency Medicine

## 2024-02-11 ENCOUNTER — Encounter: Payer: Self-pay | Admitting: Emergency Medicine

## 2024-02-11 ENCOUNTER — Ambulatory Visit: Payer: Self-pay | Admitting: Emergency Medicine

## 2024-02-11 VITALS — BP 114/88 | HR 79 | Temp 98.1°F | Ht 68.0 in | Wt 179.0 lb

## 2024-02-11 DIAGNOSIS — R7303 Prediabetes: Secondary | ICD-10-CM | POA: Diagnosis not present

## 2024-02-11 DIAGNOSIS — F429 Obsessive-compulsive disorder, unspecified: Secondary | ICD-10-CM | POA: Diagnosis not present

## 2024-02-11 DIAGNOSIS — Z0001 Encounter for general adult medical examination with abnormal findings: Secondary | ICD-10-CM

## 2024-02-11 DIAGNOSIS — Z13220 Encounter for screening for lipoid disorders: Secondary | ICD-10-CM | POA: Diagnosis not present

## 2024-02-11 DIAGNOSIS — Z130 Encounter for screening for diseases of the blood and blood-forming organs and certain disorders involving the immune mechanism: Secondary | ICD-10-CM | POA: Diagnosis not present

## 2024-02-11 DIAGNOSIS — Z13228 Encounter for screening for other metabolic disorders: Secondary | ICD-10-CM | POA: Diagnosis not present

## 2024-02-11 DIAGNOSIS — E785 Hyperlipidemia, unspecified: Secondary | ICD-10-CM | POA: Diagnosis not present

## 2024-02-11 DIAGNOSIS — Z0000 Encounter for general adult medical examination without abnormal findings: Secondary | ICD-10-CM

## 2024-02-11 DIAGNOSIS — Z125 Encounter for screening for malignant neoplasm of prostate: Secondary | ICD-10-CM

## 2024-02-11 DIAGNOSIS — Z1211 Encounter for screening for malignant neoplasm of colon: Secondary | ICD-10-CM

## 2024-02-11 DIAGNOSIS — Z1329 Encounter for screening for other suspected endocrine disorder: Secondary | ICD-10-CM

## 2024-02-11 LAB — COMPREHENSIVE METABOLIC PANEL WITH GFR
ALT: 22 U/L (ref 0–53)
AST: 22 U/L (ref 0–37)
Albumin: 4.3 g/dL (ref 3.5–5.2)
Alkaline Phosphatase: 52 U/L (ref 39–117)
BUN: 20 mg/dL (ref 6–23)
CO2: 26 meq/L (ref 19–32)
Calcium: 9.2 mg/dL (ref 8.4–10.5)
Chloride: 104 meq/L (ref 96–112)
Creatinine, Ser: 1.15 mg/dL (ref 0.40–1.50)
GFR: 74.61 mL/min (ref >60.00)
Glucose, Bld: 98 mg/dL (ref 70–99)
Potassium: 4.4 meq/L (ref 3.5–5.1)
Sodium: 137 meq/L (ref 135–145)
Total Bilirubin: 0.5 mg/dL (ref 0.2–1.2)
Total Protein: 6.8 g/dL (ref 6.0–8.3)

## 2024-02-11 LAB — CBC WITH DIFFERENTIAL/PLATELET
Basophils Absolute: 0.1 10*3/uL (ref 0.0–0.1)
Basophils Relative: 1.4 % (ref 0.0–3.0)
Eosinophils Absolute: 0.1 10*3/uL (ref 0.0–0.7)
Eosinophils Relative: 2.4 % (ref 0.0–5.0)
HCT: 41.3 % (ref 39.0–52.0)
Hemoglobin: 13.8 g/dL (ref 13.0–17.0)
Lymphocytes Relative: 17.2 % (ref 12.0–46.0)
Lymphs Abs: 0.9 10*3/uL (ref 0.7–4.0)
MCHC: 33.5 g/dL (ref 30.0–36.0)
MCV: 85.6 fl (ref 78.0–100.0)
Monocytes Absolute: 0.3 10*3/uL (ref 0.1–1.0)
Monocytes Relative: 5.8 % (ref 3.0–12.0)
Neutro Abs: 3.9 10*3/uL (ref 1.4–7.7)
Neutrophils Relative %: 73.2 % (ref 43.0–77.0)
Platelets: 234 10*3/uL (ref 150.0–400.0)
RBC: 4.82 Mil/uL (ref 4.22–5.81)
RDW: 13.7 % (ref 11.5–15.5)
WBC: 5.3 10*3/uL (ref 4.0–10.5)

## 2024-02-11 LAB — LIPID PANEL
Cholesterol: 167 mg/dL (ref 0–200)
HDL: 70.3 mg/dL (ref >39.00)
LDL Cholesterol: 87 mg/dL (ref 0–99)
NonHDL: 96.48
Total CHOL/HDL Ratio: 2
Triglycerides: 47 mg/dL (ref 0.0–149.0)
VLDL: 9.4 mg/dL (ref 0.0–40.0)

## 2024-02-11 LAB — PSA: PSA: 0.31 ng/mL (ref 0.10–4.00)

## 2024-02-11 LAB — HEMOGLOBIN A1C: Hgb A1c MFr Bld: 5.9 % (ref 4.6–6.5)

## 2024-02-11 MED ORDER — SERTRALINE HCL 100 MG PO TABS: 100.0000 mg | ORAL_TABLET | Freq: Every day | ORAL | 1 refills | Status: AC

## 2024-02-11 NOTE — Assessment & Plan Note (Signed)
Diet and nutrition discussed Hemoglobin A1c done today Cardiovascular risks associated with diabetes discussed Benefits of exercise discussed

## 2024-02-11 NOTE — Assessment & Plan Note (Signed)
 Active and interfering with quality of life. Has been on sertraline  50 mg for couple years Would like to increase dose.  Recommend 100 mg daily Recommend psychiatry evaluation.  Referral placed today.

## 2024-02-11 NOTE — Patient Instructions (Signed)
 Health Maintenance, Male  Adopting a healthy lifestyle and getting preventive care are important in promoting health and wellness. Ask your health care provider about:  The right schedule for you to have regular tests and exams.  Things you can do on your own to prevent diseases and keep yourself healthy.  What should I know about diet, weight, and exercise?  Eat a healthy diet    Eat a diet that includes plenty of vegetables, fruits, low-fat dairy products, and lean protein.  Do not eat a lot of foods that are high in solid fats, added sugars, or sodium.  Maintain a healthy weight  Body mass index (BMI) is a measurement that can be used to identify possible weight problems. It estimates body fat based on height and weight. Your health care provider can help determine your BMI and help you achieve or maintain a healthy weight.  Get regular exercise  Get regular exercise. This is one of the most important things you can do for your health. Most adults should:  Exercise for at least 150 minutes each week. The exercise should increase your heart rate and make you sweat (moderate-intensity exercise).  Do strengthening exercises at least twice a week. This is in addition to the moderate-intensity exercise.  Spend less time sitting. Even light physical activity can be beneficial.  Watch cholesterol and blood lipids  Have your blood tested for lipids and cholesterol at 50 years of age, then have this test every 5 years.  You may need to have your cholesterol levels checked more often if:  Your lipid or cholesterol levels are high.  You are older than 50 years of age.  You are at high risk for heart disease.  What should I know about cancer screening?  Many types of cancers can be detected early and may often be prevented. Depending on your health history and family history, you may need to have cancer screening at various ages. This may include screening for:  Colorectal cancer.  Prostate cancer.  Skin cancer.  Lung  cancer.  What should I know about heart disease, diabetes, and high blood pressure?  Blood pressure and heart disease  High blood pressure causes heart disease and increases the risk of stroke. This is more likely to develop in people who have high blood pressure readings or are overweight.  Talk with your health care provider about your target blood pressure readings.  Have your blood pressure checked:  Every 3-5 years if you are 9-95 years of age.  Every year if you are 85 years old or older.  If you are between the ages of 29 and 29 and are a current or former smoker, ask your health care provider if you should have a one-time screening for abdominal aortic aneurysm (AAA).  Diabetes  Have regular diabetes screenings. This checks your fasting blood sugar level. Have the screening done:  Once every three years after age 23 if you are at a normal weight and have a low risk for diabetes.  More often and at a younger age if you are overweight or have a high risk for diabetes.  What should I know about preventing infection?  Hepatitis B  If you have a higher risk for hepatitis B, you should be screened for this virus. Talk with your health care provider to find out if you are at risk for hepatitis B infection.  Hepatitis C  Blood testing is recommended for:  Everyone born from [birth year] through 1967.  Anyone  with known risk factors for hepatitis C.  Sexually transmitted infections (STIs)  You should be screened each year for STIs, including gonorrhea and chlamydia, if:  You are sexually active and are younger than 50 years of age.  You are older than 50 years of age and your health care provider tells you that you are at risk for this type of infection.  Your sexual activity has changed since you were last screened, and you are at increased risk for chlamydia or gonorrhea. Ask your health care provider if you are at risk.  Ask your health care provider about whether you are at high risk for HIV. Your health care provider  may recommend a prescription medicine to help prevent HIV infection. If you choose to take medicine to prevent HIV, you should first get tested for HIV. You should then be tested every 3 months for as long as you are taking the medicine.  Follow these instructions at home:  Alcohol use  Do not drink alcohol if your health care provider tells you not to drink.  If you drink alcohol:  Limit how much you have to 0-2 drinks a day.  Know how much alcohol is in your drink. In the [place] one drink equals one 12 oz bottle of beer (355 mL), one 5 oz glass of wine (148 mL), or one 1 oz glass of hard liquor (44 mL).  Lifestyle  Do not use any products that contain nicotine or tobacco. These products include cigarettes, chewing tobacco, and vaping devices, such as e-cigarettes. If you need help quitting, ask your health care provider.  Do not use street drugs.  Do not share needles.  Ask your health care provider for help if you need support or information about quitting drugs.  General instructions  Schedule regular health, dental, and eye exams.  Stay current with your vaccines.  Tell your health care provider if:  You often feel depressed.  You have ever been abused or do not feel safe at home.  Summary  Adopting a healthy lifestyle and getting preventive care are important in promoting health and wellness.  Follow your health care provider's instructions about healthy diet, exercising, and getting tested or screened for diseases.  Follow your health care provider's instructions on monitoring your cholesterol and blood pressure.  This information is not intended to replace advice given to you by your health care provider. Make sure you discuss any questions you have with your health care provider.  Document Revised: 12/25/2020 Document Reviewed: 12/25/2020  Elsevier Patient Education  2024 [employer].

## 2024-02-11 NOTE — Assessment & Plan Note (Signed)
Diet and nutrition discussed Lipid profile done today Cardiovascular risks associated with dyslipidemia discussed Continue atorvastatin 40 mg daily

## 2024-02-11 NOTE — Progress Notes (Signed)
 Isaiah Leonard 48 y.o.   Chief Complaint  Patient presents with   Annual Exam    Patient here for physical. Patient wanting to up his dose in his OCD meds    HISTORY OF PRESENT ILLNESS: This is a 48 y.o. male here for annual exam. Has history of OCD.  Years ago he was started on Zoloft  50 mg.  Would like to increase dose. Does not have psychiatrist to follow-up with.  Will place referral. Needs colon cancer screening Otherwise doing well.  No other complaints or medical concerns.  HPI   Prior to Admission medications   Medication Sig Start Date End Date Taking? Authorizing Provider  aspirin EC 81 MG tablet Take 81 mg by mouth daily.   Yes [provider]  atorvastatin  (LIPITOR) 40 MG tablet Take 1 tablet (40 mg total) by mouth daily. 10/08/23  Yes Purcell Emil Schanz, MD  azithromycin  (ZITHROMAX ) 250 MG tablet Sig as indicated 02/11/23  Yes Lupe Handley, Emil Schanz, MD  Salicylic Acid  27.5 % LIQD Apply 1 Application topically daily. 07/31/22  Yes de [place], Raymond J, MD  sertraline  (ZOLOFT ) 50 MG tablet Take 1 tablet (50 mg total) by mouth daily. 10/08/23  Yes Deijah Spikes, Emil Schanz, MD  typhoid (VIVOTIF ) DR capsule Take 1 capsule by mouth every other day. 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   Sulfonamide Derivatives Nausea And Vomiting    Patient Active Problem List   Diagnosis Date Noted   OCD (obsessive compulsive disorder) 06/19/2021   Dyslipidemia 12/19/2015   Arthritis of both hands 12/21/2014   Prediabetes 10/07/2011   Family history of premature CAD 04/09/2011   History of Guillain-Barre syndrome 07/21/2007   DIVERTICULITIS, HX OF 07/21/2007    Past Medical History:  Diagnosis Date   Allergy    Anxiety    Arthritis    Diverticulitis    History of Guillain-Barre syndrome    age 48   History of  shingles    Pure hypercholesterolemia     Past Surgical History:  Procedure Laterality Date   deviated septum repair  1991   SHOULDER SURGERY Left 05/2014   removed bone spurs    Social History   Socioeconomic History   Marital status: Married    Spouse name: Not on file   Number of children: 1   Years of education: Not on file   Highest education level: Not on file  Occupational History    Employer: [employer]    Comment: Teacher  Tobacco Use   Smoking status: Never   Smokeless tobacco: Never  Substance and Sexual Activity   Alcohol use: Yes    Comment: Occasional   Drug use: No   Sexual activity: Yes    Birth control/protection: None  Other Topics Concern   Not on file  Social History Narrative   Married   [place] - born 2004   Never Smoked   Alcohol use-yes    Occupation:  History Runner, 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    Caffeine use/day:  5 cups coffee daily   Does Patient Exercise:  occasional walks and yard work    Designer, 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-Excessive:  no         Social Drivers of Corporate investment banker Strain: Not on [employer] Insecurity: Not on file  Transportation Needs: Not on file  Physical Activity: Not on file  Stress: Not on file  Social Connections: Not on file  Intimate Partner Violence: Not on file    Family History  Problem Relation Age of Onset   Diabetes Mother    Hypertension Mother    Hyperlipidemia Mother    Hypertension Father    Hyperlipidemia Father    Heart attack Father        MI mid 78s   Heart disease Father    Multiple sclerosis Sister    Stroke Brother        history of etoh,a nd drug abuse   Sudden death Neg Hx      Review of Systems  Constitutional: Negative.  Negative for chills and fever.  HENT: Negative.  Negative for congestion and sore throat.   Respiratory: Negative.  Negative for cough and shortness of breath.   Cardiovascular: Negative.  Negative for chest pain and palpitations.  Gastrointestinal:  Negative for  abdominal pain, nausea and vomiting.  Genitourinary: Negative.  Negative for dysuria and hematuria.  Skin: Negative.  Negative for rash.  Neurological: Negative.  Negative for dizziness and headaches.  All other systems reviewed and are negative.   Today's Vitals   02/11/24 0801  BP: 114/88  Pulse: 79  Temp: 98.1 F (36.7 C)  TempSrc: Oral  SpO2: 97%  Weight: 179 lb (81.2 kg)  Height: 5' 8 (1.727 m)   Body mass index is 27.22 kg/m.   Physical Exam Vitals reviewed.  Constitutional:      Appearance: Normal appearance.  HENT:     Head: Normocephalic.     Right Ear: Tympanic membrane, ear canal and external ear normal.     Left Ear: Tympanic membrane, ear canal and external ear normal.     Mouth/Throat:     Mouth: Mucous membranes are moist.     Pharynx: Oropharynx is clear.   Eyes:     Extraocular Movements: Extraocular movements intact.     Conjunctiva/sclera: Conjunctivae normal.     Pupils: Pupils are equal, round, and reactive to light.    Cardiovascular:     Rate and Rhythm: Normal rate and regular rhythm.     Pulses: Normal pulses.     Heart sounds: Normal heart sounds.  Pulmonary:     Effort: Pulmonary effort is normal.     Breath sounds: Normal breath sounds.  Abdominal:     Palpations: Abdomen is soft.     Tenderness: There is no abdominal tenderness.   Musculoskeletal:     Cervical back: No tenderness.  Lymphadenopathy:     Cervical: No cervical adenopathy.   Skin:    General: Skin is warm and dry.     Capillary Refill: Capillary refill takes less than 2 seconds.   Neurological:     General: No focal deficit present.     Mental Status: He is alert and oriented to person, place, and time.   Psychiatric:        Mood and Affect: Mood normal.        Behavior: Behavior normal.      ASSESSMENT & PLAN: Problem List Items Addressed This Visit       Other   Prediabetes   Diet and nutrition discussed Hemoglobin A1c done today Cardiovascular  risks associated with diabetes discussed Benefits of exercise discussed      Relevant Orders   Hemoglobin A1c   Dyslipidemia   Diet and nutrition discussed Lipid profile done  today Cardiovascular risks associated with dyslipidemia discussed Continue atorvastatin  40 mg daily      Relevant Orders   Lipid panel   OCD (obsessive compulsive disorder)   Active and interfering with quality of life. Has been on sertraline  50 mg for couple years Would like to increase dose.  Recommend 100 mg daily Recommend psychiatry evaluation.  Referral placed today.      Relevant Medications   sertraline  (ZOLOFT ) 100 MG tablet   Other Relevant Orders   Ambulatory referral to Psychiatry   Other Visit Diagnoses       Encounter for general adult medical examination with abnormal findings    -  Primary   Relevant Orders   Comprehensive metabolic panel with GFR   CBC with Differential/Platelet   Hemoglobin A1c   Lipid panel   PSA   Cologuard     Colon cancer screening       Relevant Orders   Cologuard     Screening for deficiency anemia       Relevant Orders   CBC with Differential/Platelet     Screening for lipoid disorders         Screening for endocrine, metabolic and immunity disorder       Relevant Orders   Comprehensive metabolic panel with GFR   Hemoglobin A1c     Screening for prostate cancer       Relevant Orders   PSA      Modifiable risk factors discussed with patient. Anticipatory guidance according to age provided. The following topics were also discussed: Social Determinants of Health Smoking.  Non-smoker Diet and nutrition Benefits of exercise Cancer screening and need for colon cancer screening.  Chooses Cologuard. Vaccinations review and recommendations Cardiovascular risk assessment and need for blood work The 10-year ASCVD risk score (Arnett DK, et al., 2019) is: 1.5%   Values used to calculate the score:     Age: 22 years     Clincally relevant sex: Male      Is Non-Hispanic African American: No     Diabetic: No     Tobacco smoker: No     Systolic Blood Pressure: 114 mmHg     Is BP treated: No     HDL Cholesterol: 78.1 mg/dL     Total Cholesterol: 197 mg/dL  Mental health including depression and anxiety Fall and accident prevention  Patient Instructions  Health Maintenance, Male Adopting a healthy lifestyle and getting preventive care are important in promoting health and wellness. Ask your health care provider about: The right schedule for you to have regular tests and exams. Things you can do on your own to prevent diseases and keep yourself healthy. What should I know about diet, weight, and exercise? Eat a healthy diet  Eat a diet that includes plenty of vegetables, fruits, low-fat dairy products, and lean protein. Do not eat a lot of foods that are high in solid fats, added sugars, or sodium. Maintain a healthy weight Body mass index (BMI) is a measurement that can be used to identify possible weight problems. It estimates body fat based on height and weight. Your health care provider can help determine your BMI and help you achieve or maintain a healthy weight. Get regular exercise Get regular exercise. This is one of the most important things you can do for your health. Most adults should: Exercise for at least 150 minutes each week. The exercise should increase your heart rate and make you sweat (moderate-intensity exercise). Do strengthening exercises at  least twice a week. This is in addition to the moderate-intensity exercise. Spend less time sitting. Even light physical activity can be beneficial. Watch cholesterol and blood lipids Have your blood tested for lipids and cholesterol at 50 years of age, then have this test every 5 years. You may need to have your cholesterol levels checked more often if: Your lipid or cholesterol levels are high. You are older than 50 years of age. You are at high risk for heart disease. What  should I know about cancer screening? Many types of cancers can be detected early and may often be prevented. Depending on your health history and family history, you may need to have cancer screening at various ages. This may include screening for: Colorectal cancer. Prostate cancer. Skin cancer. Lung cancer. What should I know about heart disease, diabetes, and high blood pressure? Blood pressure and heart disease High blood pressure causes heart disease and increases the risk of stroke. This is more likely to develop in people who have high blood pressure readings or are overweight. Talk with your health care provider about your target blood pressure readings. Have your blood pressure checked: Every 3-5 years if you are 44-61 years of age. Every year if you are 45 years old or older. If you are between the ages of 41 and 61 and are a current or former smoker, ask your health care provider if you should have a one-time screening for abdominal aortic aneurysm (AAA). Diabetes Have regular diabetes screenings. This checks your fasting blood sugar level. Have the screening done: Once every three years after age 53 if you are at a normal weight and have a low risk for diabetes. More often and at a younger age if you are overweight or have a high risk for diabetes. What should I know about preventing infection? Hepatitis B If you have a higher risk for hepatitis B, you should be screened for this virus. Talk with your health care provider to find out if you are at risk for hepatitis B infection. Hepatitis C Blood testing is recommended for: Everyone born from [birth year] through 1965. Anyone with known risk factors for hepatitis C. Sexually transmitted infections (STIs) You should be screened each year for STIs, including gonorrhea and chlamydia, if: You are sexually active and are younger than 49 years of age. You are older than 50 years of age and your health care provider tells you that you are  at risk for this type of infection. Your sexual activity has changed since you were last screened, and you are at increased risk for chlamydia or gonorrhea. Ask your health care provider if you are at risk. Ask your health care provider about whether you are at high risk for HIV. Your health care provider may recommend a prescription medicine to help prevent HIV infection. If you choose to take medicine to prevent HIV, you should first get tested for HIV. You should then be tested every 3 months for as long as you are taking the medicine. Follow these instructions at home: Alcohol use Do not drink alcohol if your health care provider tells you not to drink. If you drink alcohol: Limit how much you have to 0-2 drinks a day. Know how much alcohol is in your drink. In the [place] one drink equals one 12 oz bottle of beer (355 mL), one 5 oz glass of wine (148 mL), or one 1 oz glass of hard liquor (44 mL). Lifestyle Do not use any products that contain  nicotine or tobacco. These products include cigarettes, chewing tobacco, and vaping devices, such as e-cigarettes. If you need help quitting, ask your health care provider. Do not use street drugs. Do not share needles. Ask your health care provider for help if you need support or information about quitting drugs. General instructions Schedule regular health, dental, and eye exams. Stay current with your vaccines. Tell your health care provider if: You often feel depressed. You have ever been abused or do not feel safe at home. Summary Adopting a healthy lifestyle and getting preventive care are important in promoting health and wellness. Follow your health care provider's instructions about healthy diet, exercising, and getting tested or screened for diseases. Follow your health care provider's instructions on monitoring your cholesterol and blood pressure. This information is not intended to replace advice given to you by your health care provider.  Make sure you discuss any questions you have with your health care provider. Document Revised: 12/25/2020 Document Reviewed: 12/25/2020 Elsevier Patient Education  2024 Elsevier Inc.    Emil Schaumann, MD [facility]

## 2024-03-03 ENCOUNTER — Ambulatory Visit: Admitting: Emergency Medicine

## 2024-03-03 ENCOUNTER — Encounter: Payer: Self-pay | Admitting: Emergency Medicine

## 2024-03-03 VITALS — BP 130/84 | HR 78 | Temp 98.0°F | Ht 68.0 in | Wt 177.0 lb

## 2024-03-03 DIAGNOSIS — B078 Other viral warts: Secondary | ICD-10-CM | POA: Diagnosis not present

## 2024-03-03 NOTE — Progress Notes (Signed)
 Isaiah Leonard 48 y.o.   No chief complaint on file.   HISTORY OF PRESENT ILLNESS: This is a 48 y.o. male complaining of wart to left elbow Needs dermatology referral  HPI   Prior to Admission medications   Medication Sig Start Date End Date Taking? Authorizing Provider  aspirin EC 81 MG tablet Take 81 mg by mouth daily.   Yes [provider]  atorvastatin  (LIPITOR) 40 MG tablet Take 1 tablet (40 mg total) by mouth daily. 10/08/23  Yes Purcell Emil Schanz, MD  azithromycin  (ZITHROMAX ) 250 MG tablet Sig as indicated 02/11/23  Yes Yohan Samons, Emil Schanz, MD  Salicylic Acid  27.5 % LIQD Apply 1 Application topically daily. 07/31/22  Yes de [place], Raymond J, MD  sertraline  (ZOLOFT ) 100 MG tablet Take 1 tablet (100 mg total) by mouth daily. 02/11/24  Yes [place] Droll, Emil Schanz, MD  typhoid (VIVOTIF ) DR capsule Take 1 capsule by mouth every other day. 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   Sulfonamide Derivatives Nausea And Vomiting    Patient Active Problem List   Diagnosis Date Noted   OCD (obsessive compulsive disorder) 06/19/2021   Dyslipidemia 12/19/2015   Arthritis of both hands 12/21/2014   Prediabetes 10/07/2011   Family history of premature CAD 04/09/2011   History of Guillain-Barre syndrome 07/21/2007   DIVERTICULITIS, HX OF 07/21/2007    Past Medical History:  Diagnosis Date   Allergy    Anxiety    Arthritis    Diverticulitis    History of Guillain-Barre syndrome    age 70   History of shingles    Pure hypercholesterolemia     Past Surgical History:  Procedure Laterality Date   deviated septum repair  1991   SHOULDER SURGERY Left 05/2014   removed bone spurs    Social History   Socioeconomic History   Marital status: Married    Spouse name: Not on file   Number of children: 1   Years  of education: Not on file   Highest education level: Not on file  Occupational History    Employer: [employer]    Comment: Teacher  Tobacco Use   Smoking status: Never   Smokeless tobacco: Never  Substance and Sexual Activity   Alcohol use: Yes    Comment: Occasional   Drug use: No   Sexual activity: Yes    Birth control/protection: None  Other Topics Concern   Not on file  Social History Narrative   Married   Isaiah Leonard - born 2004   Never Smoked   Alcohol use-yes    Occupation:  History Runner, 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    Caffeine use/day:  5 cups coffee daily   Does Patient Exercise:  occasional walks and yard work    Designer, 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-Excessive:  no         Social Drivers of Corporate investment banker Strain: Not on [employer] Insecurity: Not on file  Transportation Needs: Not on file  Physical Activity: Not on file  Stress: Not on file  Social Connections: Not on file  Intimate Partner Violence: Not on file    Family History  Problem Relation Age of Onset   Diabetes Mother    Hypertension Mother    Hyperlipidemia Mother    Hypertension Father  Hyperlipidemia Father    Heart attack Father        MI mid 29s   Heart disease Father    Multiple sclerosis Sister    Stroke Brother        history of etoh,a nd drug abuse   Sudden death Neg Hx      Review of Systems  Constitutional: Negative.  Negative for chills and fever.  HENT:  Negative for congestion and sore throat.   Respiratory: Negative.  Negative for cough and shortness of breath.   Cardiovascular:  Negative for chest pain and palpitations.  Gastrointestinal:  Negative for nausea and vomiting.  Skin: Negative.  Negative for rash.  Neurological:  Negative for dizziness and headaches.  All other systems reviewed and are negative.   Vitals:   03/03/24 0815  BP: 130/84  Pulse: 78  Temp: 98 F (36.7 C)  SpO2: 98%    Physical Exam Vitals reviewed.  Constitutional:      Appearance: Normal appearance.   HENT:     Head: Normocephalic.  Eyes:     Extraocular Movements: Extraocular movements intact.  Cardiovascular:     Rate and Rhythm: Normal rate.  Pulmonary:     Effort: Pulmonary effort is normal.  Skin:    General: Skin is warm and dry.     Comments: Common wart to tip of left elbow.  See picture below.  Neurological:     Mental Status: He is alert and oriented to person, place, and time.  Psychiatric:        Mood and Affect: Mood normal.        Behavior: Behavior normal.      ASSESSMENT & PLAN: Problem List Items Addressed This Visit       Musculoskeletal and Integument   Common wart - Primary   Clinically stable.  No sign of infection. No complications detected Recommend dermatology evaluation Referral placed today.      Relevant Orders   Ambulatory referral to Dermatology   Patient Instructions  Warts  Warts are small growths on the skin. They are common, and they are caused by a virus. Warts can be found on many parts of the body. A person may have one wart or many warts. Most warts will go away on their own with time, but this could take many months to a few years. If needed, warts can be treated. What are the causes? A type of virus called HPV causes warts. HPV can spread from person to person through touching. Warts can also spread to other parts of the body when a person scratches a wart and then scratches another part of his or her body. What increases the risk? You are more likely to get warts if: You are 99-29 years old. You have a weak body defense system (immune system). You are Caucasian. What are the signs or symptoms? The main symptom of this condition is small growths on the skin. Warts may: Have different shapes. They may be round, oval, or uneven. Feel rough to the touch. Be the color of your skin or light yellow, brown, or gray. Often be less than  inch (1.3 cm) in size. Go away and then come back again. Most warts do not hurt, but some  can hurt if they are large or if they are on the bottom of your feet. How is this diagnosed? A wart can often be diagnosed by how it looks. In some cases, the doctor might remove a little bit of  the wart to test it (biopsy). How is this treated? Most of the time, warts do not need treatment. Sometimes people want warts removed. If treatment is needed or wanted, it may include: Putting creams or patches with medicine in them on the wart. Putting duct tape over the top of the wart. Freezing the wart. Burning the wart with: A laser. An electric probe. Giving a shot of medicine into the wart to help the body's defense system fight off the wart. Surgery to remove the wart. Follow these instructions at home:  Medicines Use over-the-counter and prescription medicines only as told by your doctor. Do not use over-the-counter wart medicines on your face or genitals before asking your doctor. Lifestyle Keep your body's defense system healthy. To do this: Eat a healthy diet. Get enough sleep. Do not smoke or use any products that contain nicotine or tobacco. If you need help quitting, ask your doctor. General instructions Wash your hands after you touch a wart. Do not scratch or pick at a wart. Avoid shaving hair that is over a wart. Keep all follow-up visits. Contact a doctor if: Your warts do not get better after treatment. You have redness, swelling, or pain at the site of a wart. Your wart is bleeding, and the bleeding does not stop when you lightly press on the wart. You have diabetes and you get a wart. Summary Warts are small growths on the skin. They are common and are caused by a virus. Most of the time, warts do not need treatment. Sometimes people want warts removed. If treatment is needed or wanted, there are many options. Apply over-the-counter and prescription medicines only as told by your doctor. Wash your hands after you touch a wart. Keep all follow-up visits. This  information is not intended to replace advice given to you by your health care provider. Make sure you discuss any questions you have with your health care provider. Document Revised: 09/07/2021 Document Reviewed: 09/07/2021 Elsevier Patient Education  2024 Elsevier Inc.    Emil Schaumann, MD [facility] at [facility]

## 2024-03-03 NOTE — Patient Instructions (Signed)
Warts  Warts are small growths on the skin. They are common, and they are caused by a virus. Warts can be found on many parts of the body. A person may have one wart or many warts. Most warts will go away on their own with time, but this could take many months to a few years. If needed, warts can be treated. What are the causes? A type of virus called HPV causes warts. HPV can spread from person to person through touching. Warts can also spread to other parts of the body when a person scratches a wart and then scratches another part of his or her body. What increases the risk? You are more likely to get warts if: You are 10-20 years old. You have a weak body defense system (immune system). You are Caucasian. What are the signs or symptoms? The main symptom of this condition is small growths on the skin. Warts may: Have different shapes. They may be round, oval, or uneven. Feel rough to the touch. Be the color of your skin or light yellow, brown, or gray. Often be less than  inch (1.3 cm) in size. Go away and then come back again. Most warts do not hurt, but some can hurt if they are large or if they are on the bottom of your feet. How is this diagnosed? A wart can often be diagnosed by how it looks. In some cases, the doctor might remove a little bit of the wart to test it (biopsy). How is this treated? Most of the time, warts do not need treatment. Sometimes people want warts removed. If treatment is needed or wanted, it may include: Putting creams or patches with medicine in them on the wart. Putting duct tape over the top of the wart. Freezing the wart. Burning the wart with: A laser. An electric probe. Giving a shot of medicine into the wart to help the body's defense system fight off the wart. Surgery to remove the wart. Follow these instructions at home:  Medicines Use over-the-counter and prescription medicines only as told by your doctor. Do not use over-the-counter wart  medicines on your face or genitals before asking your doctor. Lifestyle Keep your body's defense system healthy. To do this: Eat a healthy diet. Get enough sleep. Do not smoke or use any products that contain nicotine or tobacco. If you need help quitting, ask your doctor. General instructions Wash your hands after you touch a wart. Do not scratch or pick at a wart. Avoid shaving hair that is over a wart. Keep all follow-up visits. Contact a doctor if: Your warts do not get better after treatment. You have redness, swelling, or pain at the site of a wart. Your wart is bleeding, and the bleeding does not stop when you lightly press on the wart. You have diabetes and you get a wart. Summary Warts are small growths on the skin. They are common and are caused by a virus. Most of the time, warts do not need treatment. Sometimes people want warts removed. If treatment is needed or wanted, there are many options. Apply over-the-counter and prescription medicines only as told by your doctor. Wash your hands after you touch a wart. Keep all follow-up visits. This information is not intended to replace advice given to you by your health care provider. Make sure you discuss any questions you have with your health care provider. Document Revised: 09/07/2021 Document Reviewed: 09/07/2021 Elsevier Patient Education  2024 [employer]  

## 2024-03-03 NOTE — Assessment & Plan Note (Signed)
 Clinically stable.  No sign of infection. No complications detected Recommend dermatology evaluation Referral placed today.

## 2024-04-05 ENCOUNTER — Other Ambulatory Visit: Payer: Self-pay | Admitting: Emergency Medicine

## 2024-05-18 ENCOUNTER — Other Ambulatory Visit: Payer: Self-pay

## 2024-09-17 ENCOUNTER — Ambulatory Visit: Admitting: Internal Medicine

## 2024-09-17 ENCOUNTER — Ambulatory Visit: Admitting: Plastic Surgery

## 2024-09-17 ENCOUNTER — Encounter: Payer: Self-pay | Admitting: Internal Medicine

## 2024-09-17 VITALS — BP 122/80 | HR 77 | Temp 97.1°F | Ht 68.0 in | Wt 177.4 lb

## 2024-09-17 VITALS — BP 167/89 | HR 75 | Ht 68.0 in | Wt 175.4 lb

## 2024-09-17 DIAGNOSIS — R519 Headache, unspecified: Secondary | ICD-10-CM | POA: Insufficient documentation

## 2024-09-17 DIAGNOSIS — S0181XA Laceration without foreign body of other part of head, initial encounter: Secondary | ICD-10-CM | POA: Diagnosis not present

## 2024-09-17 DIAGNOSIS — W540XXA Bitten by dog, initial encounter: Secondary | ICD-10-CM | POA: Diagnosis not present

## 2024-09-17 DIAGNOSIS — S0185XA Open bite of other part of head, initial encounter: Secondary | ICD-10-CM | POA: Diagnosis not present

## 2024-09-17 NOTE — Assessment & Plan Note (Signed)
 A deep laceration on the chin was repaired with a flap and drain. Dog is fully up to date on vaccines. Capillary refill is good. Pain is managed with tramadol, and there are no signs of infection. Augmentin is prescribed for infection prevention. Continue Augmentin and monitor for infection signs such as increased swelling, redness, fever, or discharge. I am willing to refill tramadol if needed for pain. Follow up with a plastic surgeon later today for drain removal and evaluation. No return visit is needed unless complications arise, provided the plastic surgeon manages follow-up. If th plastic surgeon does not need any follow up we are happy to manage the follow up of this wound.

## 2024-09-17 NOTE — Progress Notes (Signed)
 "   Referring Provider Isaiah Emil Schanz, MD [address] [place],  [place] [address]   CC:  Chief Complaint  Patient presents with   Advice Only      Isaiah Leonard is an 49 y.o. male.  HPI: Isaiah Leonard is a 49 year old male who was referred to the office after he sustained a dog bite to the chin yesterday.  He was evaluated at an outside facility where he was treated with wound washout suturing and placed on antibiotics.  A gauze drain was placed.  Allergies[1]  Outpatient Encounter Medications as of 09/17/2024  Medication Sig   amoxicillin-clavulanate (AUGMENTIN) 875-125 MG tablet Take 1 tablet by mouth 2 (two) times daily.   aspirin EC 81 MG tablet Take 81 mg by mouth daily.   atorvastatin  (LIPITOR) 40 MG tablet TAKE 1 TABLET BY MOUTH EVERY DAY   azithromycin  (ZITHROMAX ) 250 MG tablet Sig as indicated   LORazepam (ATIVAN) 1 MG tablet Take 1 mg by mouth 2 (two) times daily.   Salicylic Acid  27.5 % LIQD Apply 1 Application topically daily.   sertraline  (ZOLOFT ) 100 MG tablet Take 1 tablet (100 mg total) by mouth daily.   traMADol (ULTRAM) 50 MG tablet Take 50 mg by mouth every 6 (six) hours as needed.   typhoid (VIVOTIF ) DR capsule Take 1 capsule by mouth every other day.   No facility-administered encounter medications on file as of 09/17/2024.     Past Medical History:  Diagnosis Date   Allergy    Anxiety    Arthritis    Diverticulitis    History of Guillain-Barre syndrome    age 58   History of shingles    Pure hypercholesterolemia     Past Surgical History:  Procedure Laterality Date   deviated septum repair  1991   SHOULDER SURGERY Left 05/2014   removed bone spurs    Family History  Problem Relation Age of Onset   Diabetes Mother    Hypertension Mother    Hyperlipidemia Mother    Hypertension Father    Hyperlipidemia Father    Heart attack Father        MI mid 53s   Heart disease Father    Multiple sclerosis Sister    Stroke Brother         history of etoh,a nd drug abuse   Sudden death Neg Hx     Social History   Social History Narrative   Married   Clinical Cytogeneticist - born 2004   Never Smoked   Alcohol use-yes    Occupation:  History runner, 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  Area of tenderness and swelling.  Physical Exam    09/17/2024    2:03 PM 09/17/2024    8:17 AM 03/03/2024    8:15 AM  Vitals with BMI  Height 5' 8 5' 8 5' 8  Weight 175 lbs 6 oz 177 lbs 6 oz 177 lbs  BMI 26.68 26.98 26.92  Systolic 167 122 869  Diastolic 89 80 84  Pulse 75 77 78    General:  No acute distress,  Alert and oriented, Non-Toxic,  Normal speech and affect Face: Laceration across the lower left side of the chin.  There is sutures in place and there is dried blood on the skin.  There is a wick underneath the skin flap.  There is minimal erythema though there is some erythema on the anterior neck.  The laceration does not extend into the mouth.  Smile is intact.   Assessment/Plan Dog bite with laceration: Patient was appropriately managed at an outside facility.  At this time I do not believe that revision of the closure is warranted.  He is on appropriate antibiotics for 10 days.  The wick is removed and a bandage is placed as a will probably drain for the next couple of days.  He may continue physical activity as tolerated.  I have asked him to clean the area with dilute peroxide to get the rest of the blood off so that I can evaluate it next week.  There is a small area on the skin flap that I showed his wife that we will likely have epidermal lysis if not full-thickness loss.  If there is a scar that needs revising we will plan to do this in 6  to 12 months.  Follow-up with me next week, sooner for any concerns.  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

## 2024-09-17 NOTE — Progress Notes (Signed)
 "  Subjective:   Patient ID: Isaiah Leonard, male    DOB: 1975/10/25, 49 y.o.   MRN: [id number]  Discussed the use of AI scribe software for clinical note transcription with the patient, who gave verbal consent to proceed.  History of Present Illness Isaiah Leonard is a 49 year old male who presents for follow-up after a dog bite to the chin.  He was bitten by his own dog yesterday morning while in [place]  at a family member's house. The patient reports that the bite occurred on his chin when he attempted to intervene between his dog and another dog. He was seen at a hospital shortly after the incident, where a drain was placed; the patient recalls that this was due to the depth of the wound and ongoing bleeding.  Pain is currently tolerable with the use of tramadol, which was prescribed for a few days. The pain is not significant at the moment, especially after waking up and taking the medication. He was prescribed Lazanda , an antibiotic, to prevent infection from pathogens commonly found in dog bites. He has not experienced any issues with swallowing or breathing, although he mentions difficulty in opening and closing his mouth due to the location of the injury.  The dog involved in the incident is up to date on vaccinations, which was confirmed by paperwork. He is scheduled to see a plastic surgeon later today for further evaluation and potential removal of the drain.  No swelling in the throat, difficulty breathing, or swallowing issues. He is able to chew and talk without significant problems.  Review of Systems  Constitutional: Negative.   HENT: Negative.    Eyes: Negative.   Respiratory:  Negative for cough, chest tightness and shortness of breath.   Cardiovascular:  Negative for chest pain, palpitations and leg swelling.  Gastrointestinal:  Negative for abdominal distention, abdominal pain, constipation, diarrhea, nausea and vomiting.  Musculoskeletal: Negative.   Skin:  Positive  for wound.  Neurological: Negative.   Psychiatric/Behavioral: Negative.      Objective:  Physical Exam Constitutional:      Appearance: He is well-developed.  HENT:     Head: Normocephalic and atraumatic.  Cardiovascular:     Rate and Rhythm: Normal rate and regular rhythm.  Pulmonary:     Effort: Pulmonary effort is normal. No respiratory distress.     Breath sounds: Normal breath sounds. No wheezing or rales.  Abdominal:     General: Bowel sounds are normal. There is no distension.     Palpations: Abdomen is soft.     Tenderness: There is no abdominal tenderness.  Musculoskeletal:     Cervical back: Normal range of motion.  Skin:    General: Skin is warm and dry.     Comments: Wound on the chin and upper neck midline. With stitches and drain present. There is cap refill on the flap and no signs of infection present.   Neurological:     Mental Status: He is alert and oriented to person, place, and time.     Coordination: Coordination normal.     Vitals:   09/17/24 0817  BP: 122/80  Pulse: 77  Temp: (!) 97.1 F (36.2 C)  TempSrc: Temporal  SpO2: 97%  Weight: 177 lb 6.4 oz (80.5 kg)  Height: 5' 8 (1.727 m)    Assessment and Plan Assessment & Plan Dog bite laceration of chin / facial pain A deep laceration on the chin was repaired with a flap and drain.  Dog is fully up to date on vaccines. Capillary refill is good. Pain is managed with tramadol, and there are no signs of infection. Augmentin is prescribed for infection prevention. Continue Augmentin and monitor for infection signs such as increased swelling, redness, fever, or discharge. I am willing to refill tramadol if needed for pain. Follow up with a plastic surgeon later today for drain removal and evaluation. No return visit is needed unless complications arise, provided the plastic surgeon manages follow-up. If th plastic surgeon does not need any follow up we are happy to manage the follow up of this wound.     "

## 2024-09-23 ENCOUNTER — Ambulatory Visit: Admitting: Plastic Surgery

## 2024-09-23 VITALS — BP 143/86 | HR 77

## 2024-09-23 DIAGNOSIS — S0185XD Open bite of other part of head, subsequent encounter: Secondary | ICD-10-CM

## 2024-09-23 DIAGNOSIS — W540XXD Bitten by dog, subsequent encounter: Secondary | ICD-10-CM

## 2024-09-23 NOTE — Progress Notes (Signed)
 Mr. Granillo returns today 1 week after the laceration to his face from a dog bite.  He is doing very well.  There is no erythema around the laceration, no drainage.  Minimal tenderness to pain.  On examination the laceration is healing nicely.  There is sutures are removed today without difficulty.  He is instructed not to be too vigorous with washing and to continue just clipping the hair rather than shaving.  There is 1 area of skin glue with a suture underneath it I have asked him to return next week and I will try to remove the suture at that time.  Continue activity as tolerated.

## 2024-09-30 ENCOUNTER — Ambulatory Visit: Admitting: Plastic Surgery
